# Patient Record
Sex: Female | Born: 1970 | Race: White | Hispanic: Yes | Marital: Single | Smoking: Never smoker
Health system: Southern US, Community
[De-identification: ages and names within clinical notes are randomized; demographics above are authoritative.]

## PROBLEM LIST (undated history)

## (undated) ENCOUNTER — Emergency Department (HOSPITAL_COMMUNITY): Payer: BLUE CROSS/BLUE SHIELD

## (undated) DIAGNOSIS — D219 Benign neoplasm of connective and other soft tissue, unspecified: Secondary | ICD-10-CM

## (undated) HISTORY — DX: Benign neoplasm of connective and other soft tissue, unspecified: D21.9

---

## 2000-05-17 ENCOUNTER — Other Ambulatory Visit: Admission: RE | Admit: 2000-05-17 | Discharge: 2000-05-17 | Payer: Self-pay | Admitting: *Deleted

## 2004-01-08 ENCOUNTER — Other Ambulatory Visit: Admission: RE | Admit: 2004-01-08 | Discharge: 2004-01-08 | Payer: Self-pay | Admitting: Obstetrics and Gynecology

## 2004-01-09 ENCOUNTER — Encounter: Admission: RE | Admit: 2004-01-09 | Discharge: 2004-01-09 | Payer: Self-pay | Admitting: Obstetrics and Gynecology

## 2005-03-02 ENCOUNTER — Other Ambulatory Visit: Admission: RE | Admit: 2005-03-02 | Discharge: 2005-03-02 | Payer: Self-pay | Admitting: Obstetrics and Gynecology

## 2005-05-12 IMAGING — US US SOFT TISSUE HEAD/NECK
1 series · 14 of 25 positions shown · non-contrast
Comparison: none

CLINICAL DATA: Enlarged thyroid on the right. 
 ULTRASOUND SOFT TISSUE HEAD/NECK
 Scans over the thyroid gland were performed.  The thyroid gland is within normal limits in size.   The right lobe measures 5.7 cm sagittally with a depth of 1.3 cm and width of 1.8 cm.  The left lobe measures 5.0 cm sagittally with a depth of 1.2 cm and width of 1.8 cm.  The isthmus measures 2.8 mm in thickness.  The thyroid gland is homogeneous in echogenicity.  No focal lesions are seen other than a small hypoechoic structure in the lower pole at the right lobe measuring 2.1 x 2 mm.  
 IMPRESSION
 Negative ultrasound of the thyroid gland.

[Series 1: unknown · 0.22mm/px · 14 of 62 slices shown]
[im 1/62]
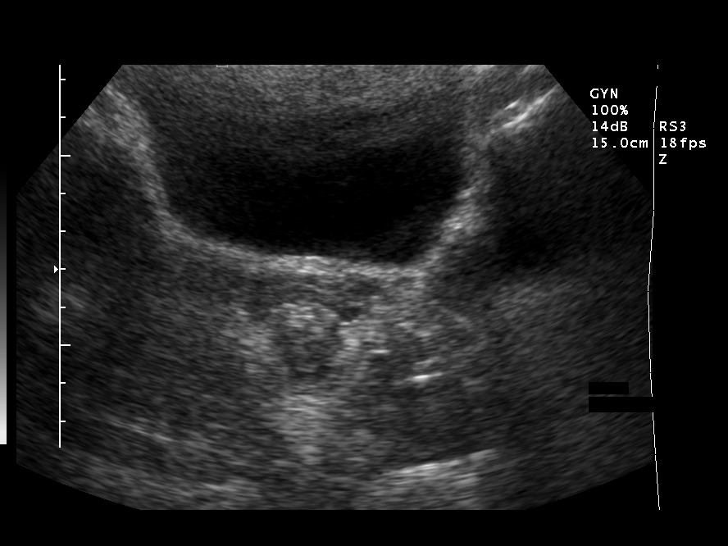
[im 6/62]
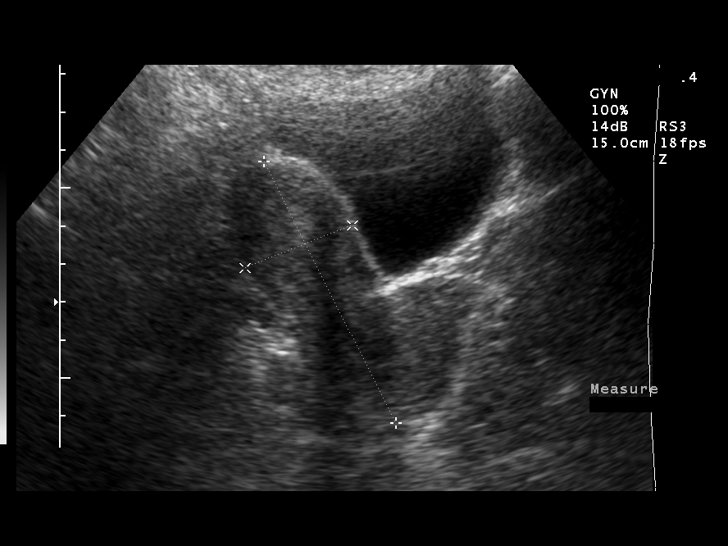
[im 11/62]
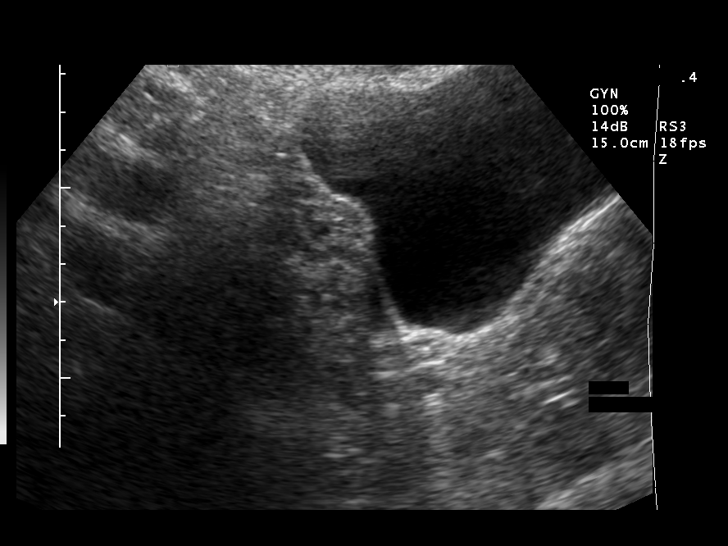
[im 16/62]
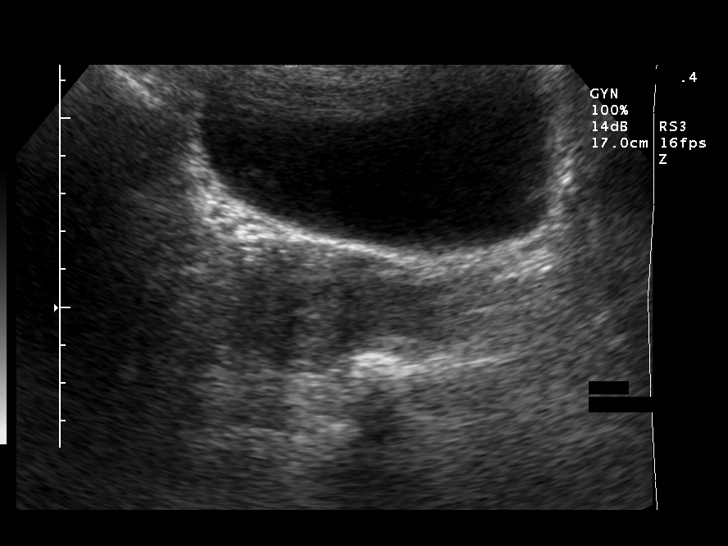
[im 21/62]
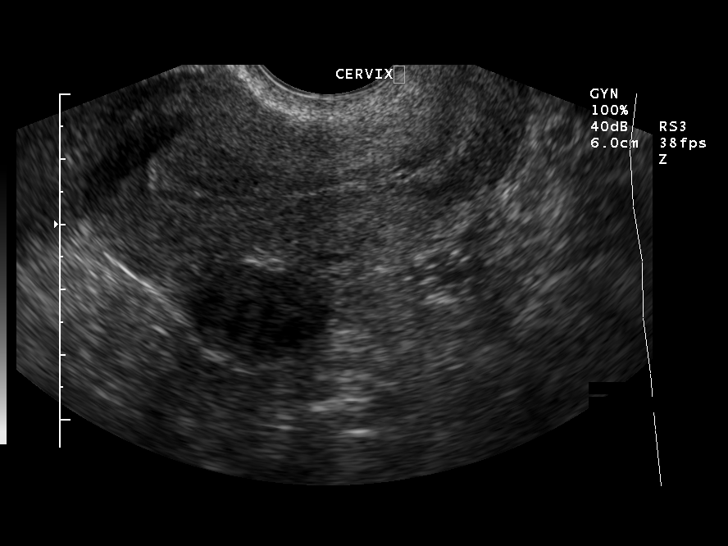
[im 23/62]
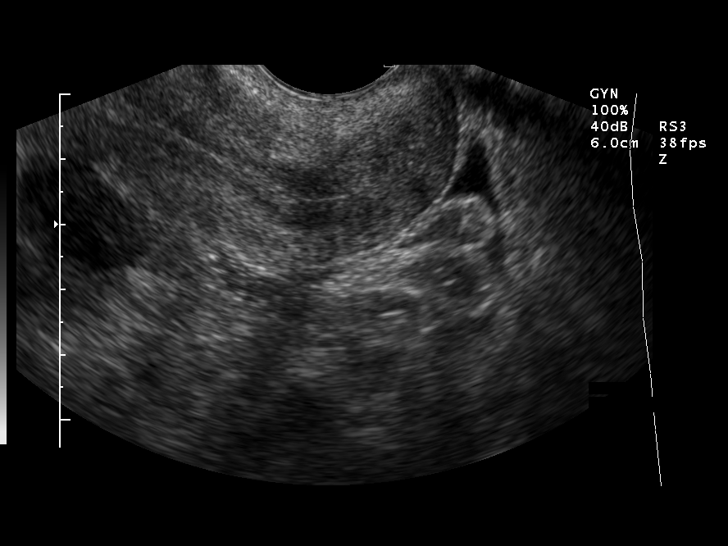
[im 28/62]
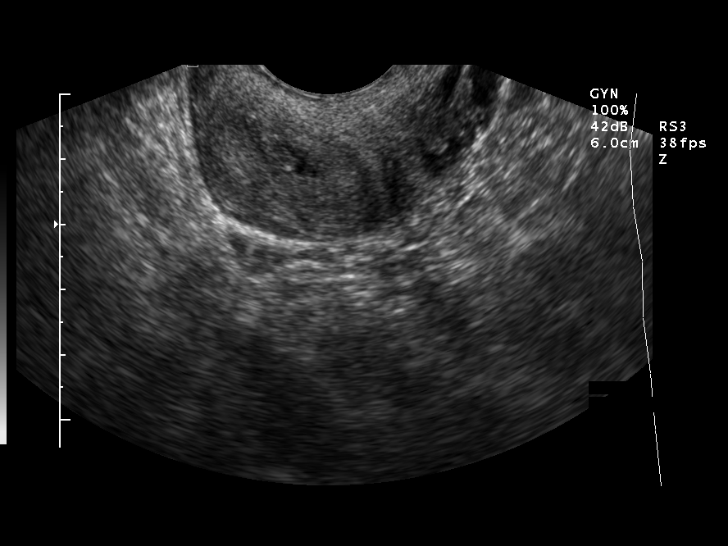
[im 34/62]
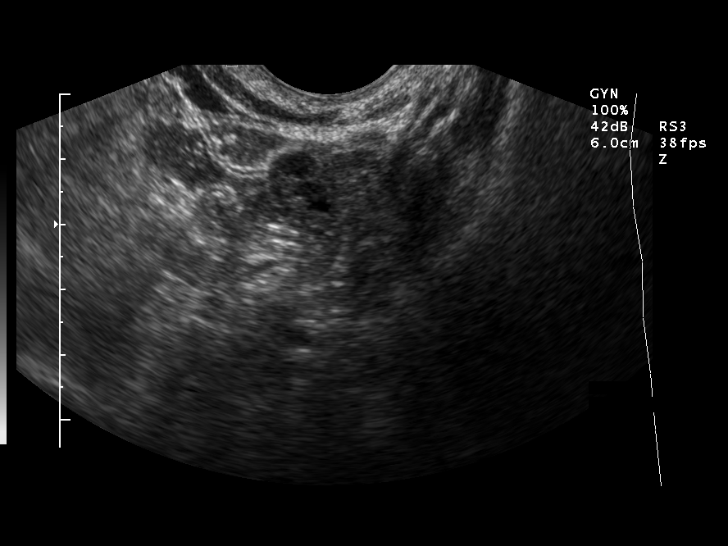
[im 39/62]
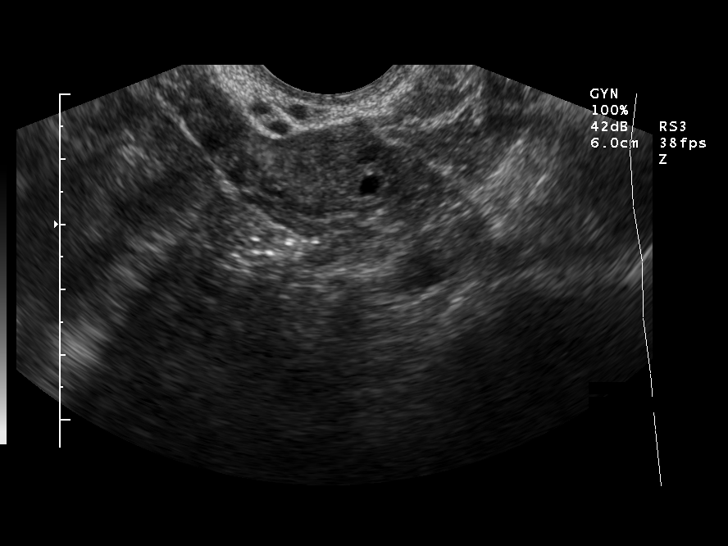
[im 41/62]
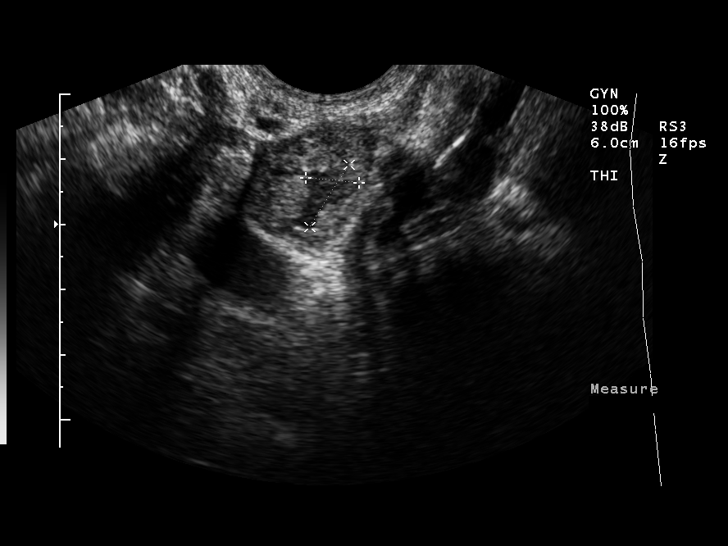
[im 46/62]
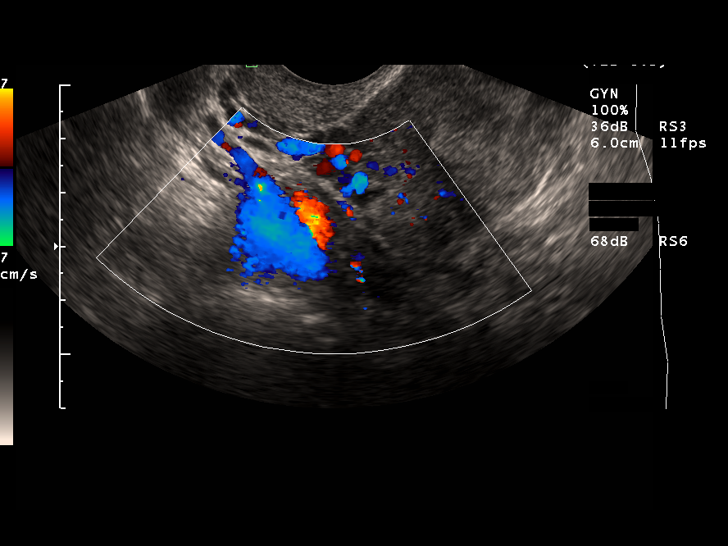
[im 51/62]
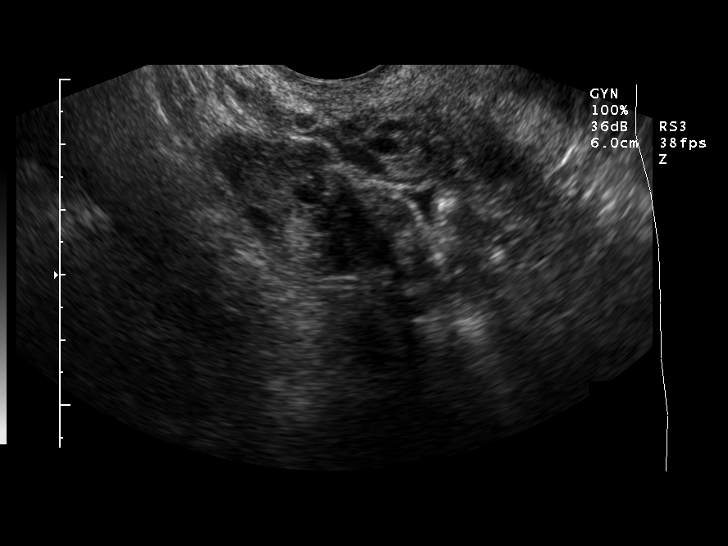
[im 56/62]
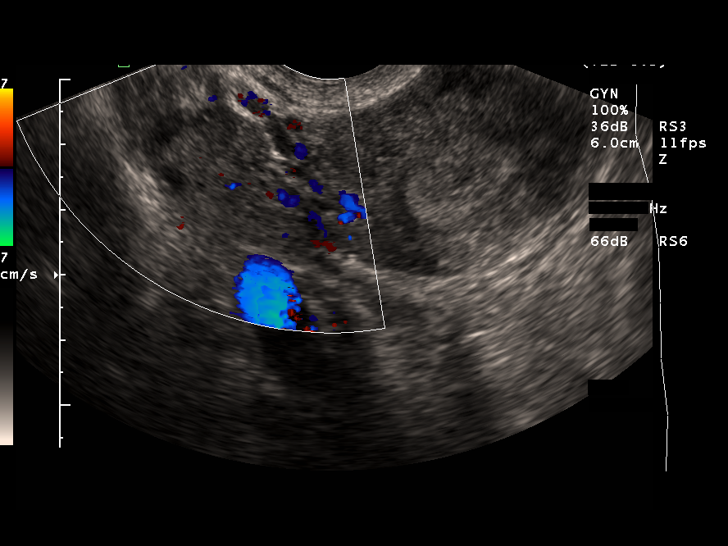
[im 62/62]
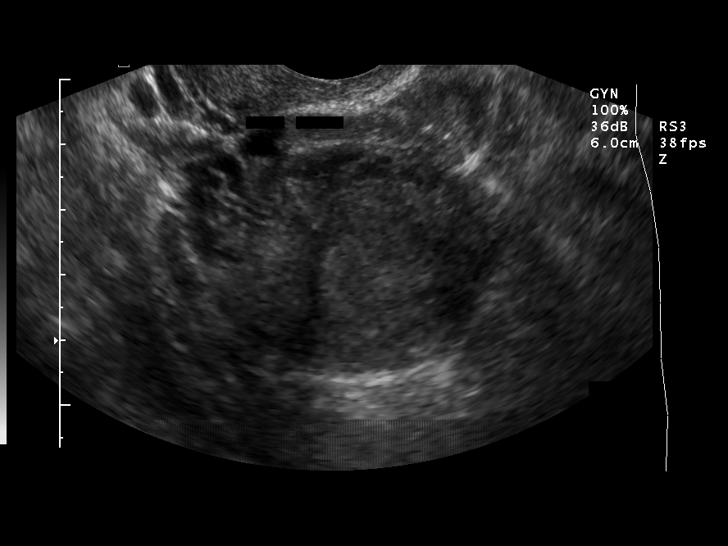

[14 of 25 positions shown; findings below may reference images not displayed]

## 2006-08-03 HISTORY — PX: MYOMECTOMY: SHX85

## 2007-01-25 ENCOUNTER — Encounter (INDEPENDENT_AMBULATORY_CARE_PROVIDER_SITE_OTHER): Payer: Self-pay | Admitting: Obstetrics and Gynecology

## 2007-01-25 ENCOUNTER — Ambulatory Visit (HOSPITAL_COMMUNITY): Admission: RE | Admit: 2007-01-25 | Discharge: 2007-01-25 | Payer: Self-pay | Admitting: Obstetrics and Gynecology

## 2010-12-16 NOTE — H&P (Signed)
NAMECHRISTYNE, [name] NO.:  [id number]   MEDICAL RECORD NO.:  [id number]          PATIENT TYPE:  AMB   LOCATION:  SDC                           FACILITY:  [facility]   PHYSICIAN:  Osborn Coho, M.D.   DATE OF BIRTH:  [birth year]   DATE OF ADMISSION:  DATE OF DISCHARGE:                              HISTORY & PHYSICAL   HISTORY OF PRESENT ILLNESS:  Ms. Stephanie [name] is a 40 year old single African-  American female, gravida 0, who presents for a hysteroscopic removal of  a submucosal fibroid because of irregular bleeding.  For years, the  patient has had prolonged spotting following her 7-day menstrual flow,  which would last for approximately one week.  The patient will describe  this spotting as being very mucus-like and brown in appearance.  This  issue was resolved when she began [place]at contraceptive patches but  in recent months, this trend has recurred since she is no longer on  hormonal contraception.  A pelvic ultrasound in June, 2008 showed a  uterus measuring 7.2 x 3.12 x 4.71 cm along with a posterior  pedunculated fibroid measuring 2.23 cm and a mid body lower uterine  segment submucosal fibroid at the cervical os measuring 1.28 cm x 0.98  cm x 1.16 cm.   A discussion was held with the patient regarding the management options  available, both medical and surgical; however, she has desired to  proceed with surgical excision of this submucosal fibroid.   PAST OB HISTORY:  OB history, gravida 0.   PAST GYN HISTORY:  Menarche 40 years old.  Last menstrual period on Jan 01, 2007.  Patient denies any history of sexually transmitted diseases.  She has a remote history of an abnormal Pap smear.  Last normal Pap  smear in April, 2008.   PAST MEDICAL HISTORY:  Sickle cell trait.  Anemia.   PAST SURGICAL HISTORY:  Negative.   FAMILY HISTORY:  Asthma, hypertension, diabetes.   SOCIAL HISTORY:  Patient is single, and she works for [employer].   HABITS:  She does  not use tobacco.  She occasionally consumes alcohol.   CURRENT MEDICATIONS:  None.   She is allergic to CODEINE.   REVIEW OF SYSTEMS:  Negative except as mentioned in history of present  illness.   PHYSICAL EXAMINATION:  VITAL SIGNS:  Blood pressure 110/80.  Weight is  182.  Height 6 feet, 1 inches tall.  NECK:  Supple without masses.  There is no thyromegaly or cervical  adenopathy.  HEART:  Regular rate and rhythm.  LUNGS:  Clear.  BACK:  No CVA tenderness.  ABDOMEN:  No tenderness, guarding, rebound, or organomegaly.  EXTREMITIES:  No clubbing, cyanosis or edema.  PELVIC:  EG/BUS is normal.  Vagina is normal.  Cervix is nontender.  No  lesions (cervix is stenotic).  Uterus is normal size, shape, and  consistency.  Nontender.  Adnexa, no masses and no tenderness.   IMPRESSION:  1. Irregular menstrual bleeding.  2. Submucosal fibroid.   DISPOSITION:  A discussion was held with the patient regarding the  indications  for her procedure along with its risks, which include but  are not limited to, reaction to anesthesia, damage to adjacent organs,  infection, excessive bleeding, uterine perforation, and endometrial  scarring.  Patient has consented to proceed with hysteroscopic removal  of a submucosal fibroid at [facility] on January 25, 2007 at 1:30 p.m.      [place] J. Adline Peals.      Osborn Coho, M.D.  Electronically Signed    EJP/MEDQ  D:  01/21/2007  T:  01/21/2007  Job:  [id number]

## 2010-12-16 NOTE — Op Note (Signed)
[name], Stephanie [name]                ACCOUNT NO.:  [id number]   MEDICAL RECORD NO.:  [id number]          PATIENT TYPE:  AMB   LOCATION:  SDC                           [facility]ACILITY:  [facility]   PHYSICIAN:  Osborn Coho, M.D.   DATE OF BIRTH:  1971-02-21   DATE OF PROCEDURE:  01/25/2007  DATE OF DISCHARGE:                               OPERATIVE REPORT   PREOPERATIVE DIAGNOSES:  1. Metrorrhagia.  2. Possible submucosal fibroid.   POSTOPERATIVE DIAGNOSES:  1. Metrorrhagia.  2. Polypoid tissue.  3. Cervical stenosis.   PROCEDURE:  1. Hysteroscopy.  2. Dilation and curettage.   ATTENDING:  Dr. Osborn Coho.   ANESTHESIA:  General via LMA.   SPECIMENS TO PATHOLOGY:  Endometrial curettings.   FLUIDS:  1000 mL.   URINE OUTPUT:  Quantity sufficient via straight catheter prior to  procedure.   ESTIMATED BLOOD LOSS:  Minimal.   HYSTEROSCOPIC FLUID DEFICIT:  60 mL of sorbitol.   COMPLICATIONS:  None.   PROCEDURE:  The patient was taken to the operating room after the risks,  benefits and alternatives were reviewed with the patient.  The patient  verbalized understanding, and consent signed and witnessed.  The patient  was placed under general anesthesia, prepped and draped in a normal  sterile fashion in the dorsal lithotomy position.  A bivalve speculum  was placed in the patient's vagina and a paracervical block administered  using a total of 10 mL of 1% lidocaine.  The anterior lip of the cervix  was grasped with a single-tooth tenaculum.  The cervix was noted to be  stenotic and lacrimal duct dilators used.  Dilation was performed for  passage of the hysteroscope.  The uterus was sounded to 8 cm.  Hysteroscope was introduced and no obvious intracavitary lesion noted.  There was polypoid-  appearing tissue, and curettage was performed and curettings sent to  pathology.  A gritty texture was noted, and all instruments were  removed.  There was good hemostasis at the tenaculum  site.  Sponge count  was correct.  The patient tolerated the procedure well and was awaiting  transfer to the recovery room in good condition.      Osborn Coho, M.D.  Electronically Signed     AR/MEDQ  D:  01/25/2007  T:  01/25/2007  Job:  [id number]

## 2011-05-20 LAB — CBC
Hemoglobin: 13.3
MCV: 85.4
Platelets: 186
RDW: 13.4
WBC: 5.8

## 2019-09-08 DIAGNOSIS — M9903 Segmental and somatic dysfunction of lumbar region: Secondary | ICD-10-CM | POA: Diagnosis not present

## 2019-09-08 DIAGNOSIS — M9902 Segmental and somatic dysfunction of thoracic region: Secondary | ICD-10-CM | POA: Diagnosis not present

## 2019-09-08 DIAGNOSIS — M9901 Segmental and somatic dysfunction of cervical region: Secondary | ICD-10-CM | POA: Diagnosis not present

## 2019-09-08 DIAGNOSIS — M9905 Segmental and somatic dysfunction of pelvic region: Secondary | ICD-10-CM | POA: Diagnosis not present

## 2019-09-11 DIAGNOSIS — M9901 Segmental and somatic dysfunction of cervical region: Secondary | ICD-10-CM | POA: Diagnosis not present

## 2019-09-11 DIAGNOSIS — M9902 Segmental and somatic dysfunction of thoracic region: Secondary | ICD-10-CM | POA: Diagnosis not present

## 2019-09-11 DIAGNOSIS — M9905 Segmental and somatic dysfunction of pelvic region: Secondary | ICD-10-CM | POA: Diagnosis not present

## 2019-09-11 DIAGNOSIS — M9903 Segmental and somatic dysfunction of lumbar region: Secondary | ICD-10-CM | POA: Diagnosis not present

## 2019-09-14 DIAGNOSIS — M9905 Segmental and somatic dysfunction of pelvic region: Secondary | ICD-10-CM | POA: Diagnosis not present

## 2019-09-14 DIAGNOSIS — M9902 Segmental and somatic dysfunction of thoracic region: Secondary | ICD-10-CM | POA: Diagnosis not present

## 2019-09-14 DIAGNOSIS — M9901 Segmental and somatic dysfunction of cervical region: Secondary | ICD-10-CM | POA: Diagnosis not present

## 2019-09-14 DIAGNOSIS — M9903 Segmental and somatic dysfunction of lumbar region: Secondary | ICD-10-CM | POA: Diagnosis not present

## 2019-09-18 DIAGNOSIS — M9901 Segmental and somatic dysfunction of cervical region: Secondary | ICD-10-CM | POA: Diagnosis not present

## 2019-09-18 DIAGNOSIS — M9903 Segmental and somatic dysfunction of lumbar region: Secondary | ICD-10-CM | POA: Diagnosis not present

## 2019-09-18 DIAGNOSIS — M9905 Segmental and somatic dysfunction of pelvic region: Secondary | ICD-10-CM | POA: Diagnosis not present

## 2019-09-18 DIAGNOSIS — M9902 Segmental and somatic dysfunction of thoracic region: Secondary | ICD-10-CM | POA: Diagnosis not present

## 2019-09-25 DIAGNOSIS — M9905 Segmental and somatic dysfunction of pelvic region: Secondary | ICD-10-CM | POA: Diagnosis not present

## 2019-09-25 DIAGNOSIS — M9901 Segmental and somatic dysfunction of cervical region: Secondary | ICD-10-CM | POA: Diagnosis not present

## 2019-09-25 DIAGNOSIS — M9903 Segmental and somatic dysfunction of lumbar region: Secondary | ICD-10-CM | POA: Diagnosis not present

## 2019-09-25 DIAGNOSIS — M9902 Segmental and somatic dysfunction of thoracic region: Secondary | ICD-10-CM | POA: Diagnosis not present

## 2019-09-28 DIAGNOSIS — M9902 Segmental and somatic dysfunction of thoracic region: Secondary | ICD-10-CM | POA: Diagnosis not present

## 2019-09-28 DIAGNOSIS — M9905 Segmental and somatic dysfunction of pelvic region: Secondary | ICD-10-CM | POA: Diagnosis not present

## 2019-09-28 DIAGNOSIS — M9901 Segmental and somatic dysfunction of cervical region: Secondary | ICD-10-CM | POA: Diagnosis not present

## 2019-09-28 DIAGNOSIS — M9903 Segmental and somatic dysfunction of lumbar region: Secondary | ICD-10-CM | POA: Diagnosis not present

## 2019-10-02 DIAGNOSIS — M9902 Segmental and somatic dysfunction of thoracic region: Secondary | ICD-10-CM | POA: Diagnosis not present

## 2019-10-02 DIAGNOSIS — M9905 Segmental and somatic dysfunction of pelvic region: Secondary | ICD-10-CM | POA: Diagnosis not present

## 2019-10-02 DIAGNOSIS — M9901 Segmental and somatic dysfunction of cervical region: Secondary | ICD-10-CM | POA: Diagnosis not present

## 2019-10-02 DIAGNOSIS — M9903 Segmental and somatic dysfunction of lumbar region: Secondary | ICD-10-CM | POA: Diagnosis not present

## 2019-10-05 DIAGNOSIS — M9901 Segmental and somatic dysfunction of cervical region: Secondary | ICD-10-CM | POA: Diagnosis not present

## 2019-10-05 DIAGNOSIS — M9903 Segmental and somatic dysfunction of lumbar region: Secondary | ICD-10-CM | POA: Diagnosis not present

## 2019-10-05 DIAGNOSIS — M9905 Segmental and somatic dysfunction of pelvic region: Secondary | ICD-10-CM | POA: Diagnosis not present

## 2019-10-05 DIAGNOSIS — M9902 Segmental and somatic dysfunction of thoracic region: Secondary | ICD-10-CM | POA: Diagnosis not present

## 2019-10-11 DIAGNOSIS — M9902 Segmental and somatic dysfunction of thoracic region: Secondary | ICD-10-CM | POA: Diagnosis not present

## 2019-10-11 DIAGNOSIS — M9901 Segmental and somatic dysfunction of cervical region: Secondary | ICD-10-CM | POA: Diagnosis not present

## 2019-10-11 DIAGNOSIS — M9905 Segmental and somatic dysfunction of pelvic region: Secondary | ICD-10-CM | POA: Diagnosis not present

## 2019-10-11 DIAGNOSIS — M9903 Segmental and somatic dysfunction of lumbar region: Secondary | ICD-10-CM | POA: Diagnosis not present

## 2019-10-16 DIAGNOSIS — M9903 Segmental and somatic dysfunction of lumbar region: Secondary | ICD-10-CM | POA: Diagnosis not present

## 2019-10-16 DIAGNOSIS — M9905 Segmental and somatic dysfunction of pelvic region: Secondary | ICD-10-CM | POA: Diagnosis not present

## 2019-10-16 DIAGNOSIS — M9901 Segmental and somatic dysfunction of cervical region: Secondary | ICD-10-CM | POA: Diagnosis not present

## 2019-10-16 DIAGNOSIS — M9902 Segmental and somatic dysfunction of thoracic region: Secondary | ICD-10-CM | POA: Diagnosis not present

## 2019-10-25 DIAGNOSIS — M9903 Segmental and somatic dysfunction of lumbar region: Secondary | ICD-10-CM | POA: Diagnosis not present

## 2019-10-25 DIAGNOSIS — M9902 Segmental and somatic dysfunction of thoracic region: Secondary | ICD-10-CM | POA: Diagnosis not present

## 2019-10-25 DIAGNOSIS — M9901 Segmental and somatic dysfunction of cervical region: Secondary | ICD-10-CM | POA: Diagnosis not present

## 2019-10-25 DIAGNOSIS — M9905 Segmental and somatic dysfunction of pelvic region: Secondary | ICD-10-CM | POA: Diagnosis not present

## 2019-11-01 DIAGNOSIS — M9905 Segmental and somatic dysfunction of pelvic region: Secondary | ICD-10-CM | POA: Diagnosis not present

## 2019-11-01 DIAGNOSIS — M9901 Segmental and somatic dysfunction of cervical region: Secondary | ICD-10-CM | POA: Diagnosis not present

## 2019-11-01 DIAGNOSIS — M9903 Segmental and somatic dysfunction of lumbar region: Secondary | ICD-10-CM | POA: Diagnosis not present

## 2019-11-01 DIAGNOSIS — M9902 Segmental and somatic dysfunction of thoracic region: Secondary | ICD-10-CM | POA: Diagnosis not present

## 2020-05-10 ENCOUNTER — Ambulatory Visit: Payer: Self-pay | Attending: Internal Medicine

## 2020-05-10 ENCOUNTER — Other Ambulatory Visit: Payer: Self-pay | Admitting: Internal Medicine

## 2020-05-10 DIAGNOSIS — Z23 Encounter for immunization: Secondary | ICD-10-CM

## 2020-05-10 NOTE — Progress Notes (Signed)
° °  Covid-19 Vaccination Clinic  Name:  Stephanie [name]    MRN: [id number] DOB: 11-Feb-1971  05/10/2020  [name] was observed post Covid-19 immunization for 15 minutes without incident. She was provided with Vaccine Information Sheet and instruction to access the V-Safe system.   [name] was instructed to call 911 with any severe reactions post vaccine:  Difficulty breathing   Swelling of face and throat   A fast heartbeat   A bad rash all over body   Dizziness and weakness   Immunizations Administered    Name Date Dose VIS Date Route   JANSSEN COVID-19 VACCINE 05/10/2020  2:49 PM 0.5 mL 09/30/2019 Intramuscular   Manufacturer: [employer]   Lot: [id number]   [place]: [id number]

## 2020-05-28 DIAGNOSIS — F4323 Adjustment disorder with mixed anxiety and depressed mood: Secondary | ICD-10-CM | POA: Diagnosis not present

## 2020-06-03 DIAGNOSIS — E559 Vitamin D deficiency, unspecified: Secondary | ICD-10-CM | POA: Diagnosis not present

## 2020-06-03 DIAGNOSIS — D649 Anemia, unspecified: Secondary | ICD-10-CM | POA: Diagnosis not present

## 2020-06-03 DIAGNOSIS — E6609 Other obesity due to excess calories: Secondary | ICD-10-CM | POA: Diagnosis not present

## 2020-06-03 DIAGNOSIS — Z833 Family history of diabetes mellitus: Secondary | ICD-10-CM | POA: Diagnosis not present

## 2020-06-07 DIAGNOSIS — F4323 Adjustment disorder with mixed anxiety and depressed mood: Secondary | ICD-10-CM | POA: Diagnosis not present

## 2020-06-12 DIAGNOSIS — F4323 Adjustment disorder with mixed anxiety and depressed mood: Secondary | ICD-10-CM | POA: Diagnosis not present

## 2020-06-25 ENCOUNTER — Ambulatory Visit: Payer: Self-pay

## 2020-07-04 DIAGNOSIS — F4323 Adjustment disorder with mixed anxiety and depressed mood: Secondary | ICD-10-CM | POA: Diagnosis not present

## 2020-07-08 DIAGNOSIS — K1329 Other disturbances of oral epithelium, including tongue: Secondary | ICD-10-CM | POA: Diagnosis not present

## 2020-07-12 DIAGNOSIS — K134 Granuloma and granuloma-like lesions of oral mucosa: Secondary | ICD-10-CM | POA: Diagnosis not present

## 2020-07-17 ENCOUNTER — Encounter: Payer: Self-pay | Admitting: Certified Nurse Midwife

## 2020-07-23 DIAGNOSIS — F4323 Adjustment disorder with mixed anxiety and depressed mood: Secondary | ICD-10-CM | POA: Diagnosis not present

## 2020-12-06 DIAGNOSIS — H00014 Hordeolum externum left upper eyelid: Secondary | ICD-10-CM | POA: Diagnosis not present

## 2020-12-06 DIAGNOSIS — S40861A Insect bite (nonvenomous) of right upper arm, initial encounter: Secondary | ICD-10-CM | POA: Diagnosis not present

## 2020-12-06 DIAGNOSIS — W57XXXA Bitten or stung by nonvenomous insect and other nonvenomous arthropods, initial encounter: Secondary | ICD-10-CM | POA: Diagnosis not present

## 2022-09-14 ENCOUNTER — Encounter: Payer: Self-pay | Admitting: Obstetrics and Gynecology

## 2022-09-14 ENCOUNTER — Ambulatory Visit (INDEPENDENT_AMBULATORY_CARE_PROVIDER_SITE_OTHER): Payer: BLUE CROSS/BLUE SHIELD | Admitting: Obstetrics and Gynecology

## 2022-09-14 ENCOUNTER — Other Ambulatory Visit (HOSPITAL_COMMUNITY)
Admission: RE | Admit: 2022-09-14 | Discharge: 2022-09-14 | Disposition: A | Discharge status: Home / Self Care | Payer: BLUE CROSS/BLUE SHIELD | Source: Ambulatory Visit | Attending: Obstetrics and Gynecology | Admitting: Obstetrics and Gynecology

## 2022-09-14 VITALS — BP 122/79 | HR 78 | Ht 72.0 in | Wt 229.0 lb

## 2022-09-14 DIAGNOSIS — Z01411 Encounter for gynecological examination (general) (routine) with abnormal findings: Secondary | ICD-10-CM | POA: Diagnosis present

## 2022-09-14 DIAGNOSIS — Z758 Other problems related to medical facilities and other health care: Secondary | ICD-10-CM | POA: Diagnosis not present

## 2022-09-14 DIAGNOSIS — D259 Leiomyoma of uterus, unspecified: Secondary | ICD-10-CM | POA: Diagnosis not present

## 2022-09-14 MED ORDER — IBUPROFEN 800 MG PO TABS: 800.0000 mg | ORAL_TABLET | Freq: Three times a day (TID) | ORAL | 1 refills | Status: AC | PRN

## 2022-09-14 NOTE — Progress Notes (Signed)
New pt to office for routine AEX. Last pap and mammo 1 year ago.   Pt states she had myomectomy in 2008, states fibroids are bigger now - may be interested in UFE.

## 2022-09-14 NOTE — Progress Notes (Signed)
Subjective:     Stephanie [name] is a 52 y.o. female P0 with LMP 08/26/22 and  BMI 31 who is here for a comprehensive physical exam. The patient reports heavy vaginal bleeding with fibroid uterus and desires [place]. Patient reports a monthly period lasting 7-10 days with the first 3 days bring heavy in flow. She is not sexually active. She admits to occasional pelvic pressure. Patient with known fibroid uterus and had plans for [place] in 2020 but that was aborted due to the pandemic. Her last ultrasound was in 08/2021 and demonstrated a 20 cm fibroid uterus. She is without any other complaints. She denies pelvic pain or abnormal discharge. She denies urinary complaints or constipation.  History reviewed. No pertinent past medical history. History reviewed. No pertinent surgical history. History reviewed. No pertinent family history.  Social History   Socioeconomic History   Marital status: Single    Spouse name: Not on file   Number of children: Not on file   Years of education: Not on file   Highest education level: Not on file  Occupational History   Not on file  Tobacco Use   Smoking status: Not on file   Smokeless tobacco: Not on file  Substance and Sexual Activity   Alcohol use: Not on file   Drug use: Not on file   Sexual activity: Not on file  Other Topics Concern   Not on file  Social History Narrative   Not on file   Social Determinants of Health   Financial Resource Strain: Not on file  Food Insecurity: Not on file  Transportation Needs: Not on file  Physical Activity: Not on file  Stress: Not on file  Social Connections: Not on file  Intimate Partner Violence: Not on file   Health Maintenance  Topic Date Due   HIV Screening  Never done   Hepatitis C Screening  Never done   DTaP/Tdap/Td (1 - Tdap) Never done   COLONOSCOPY (Pts 45-62yr Insurance coverage will need to be confirmed)  Never done   Zoster Vaccines- Shingrix (1 of 2) Never done   INFLUENZA VACCINE  Never  done   COVID-19 Vaccine (2 - 2023-24 season) 04/03/2022   MAMMOGRAM  08/21/2023   PAP SMEAR-Modifier  08/20/2024   HPV VACCINES  Aged Out       Review of Systems Pertinent items noted in HPI and remainder of comprehensive ROS otherwise negative.   Objective:  Blood pressure 122/79, pulse 78, height 6' (1.829 m), weight 229 lb (103.9 kg), last menstrual period 08/26/2022.   GENERAL: Well-developed, well-nourished female in no acute distress.  HEENT: Normocephalic, atraumatic. Sclerae anicteric.  NECK: Supple. Normal thyroid.  LUNGS: Clear to auscultation bilaterally.  HEART: Regular rate and rhythm. BREASTS: Symmetric in size. No palpable masses or lymphadenopathy, skin changes, or nipple drainage. ABDOMEN: Soft, nontender, nondistended. No organomegaly. PELVIC: Normal external female genitalia. Vagina is pink and rugated.  Normal discharge. Normal appearing cervix. Uterus is normal in size. No adnexal mass or tenderness. Chaperone present during the pelvic exam EXTREMITIES: No cyanosis, clubbing, or edema, 2+ distal pulses.     08/2021 ultrasound  EXAM:  ULTRASOUND PELVIS COMPLETE AND TRANSVAGINAL   HISTORY:  Fibroid.   TECHNIQUE:  Transabdominal scanning performed with images obtained of the region of the uterus, endometrium, ovaries and adnexa. Detail is limited by transabdominal scanning, with incomplete visualization of all structures. Transvaginal scanning was  therefore required to fully evaluate the pelvic structures and was performed. Representative 2D  gray-scale transabdominal and transvaginal images obtained.   COMPARISON:  May 14, 2018   FINDINGS:   LMP: July 30, 2021   UTERUS: Measures (L x AP x W) 20.2 x 11.4 x 14.0 cm. Anteverted. Heterogeneous echotexture.   Fibroid/s:  1, Unable to measure.  2. 4.1 x 3.9 x 3.8 cm subserosal fibroid in the right posterior body.  3. 4.3 x 3.5 x 3.4 cm subserosal fibroid in left posterior body, new.   Nabothian  cyst/s: None.   ENDOMETRIUM: Nonvisualized and distorted by the presence of large fibroids.   RIGHT OVARY:  Not visualized due to body habitus. No solid or complex adnexal lesions identified.   LEFT OVARY: Not visualized due to body habitus. No solid or complex adnexal lesions identified.   Free Fluid: Trace free pelvic fluid.    IMPRESSION:    Large uterine fibroids measuring up to 4.3 cm.   This report dictated by Alfonse Ras MD on 08/22/2021  Assessment:    Healthy female exam.       Plan:    - Reviewed ultrasound report with patient which demonstrated an enlarged fibroid uterus - Discussed management options with medication to control amount of vaginal bleeding, uterine artery embolization and definitive treatment with hysterectomy. Patient is still interested in [place] and understands that this procedure may note alleviate her pelvic pressure. Patient verbalized understanding and desires to more forward with referral. Repeat imaging to be ordered if needed by IR - Pap smear collected - Screening mammogram ordered - Patient referred to GI for colonoscopy - Patient also referred to establish care with PCP - Baseline labs ordered - Patient will be contacted with abnormal results See After Visit Summary for Counseling Recommendations

## 2022-09-15 LAB — COMPREHENSIVE METABOLIC PANEL
ALT: 13 IU/L (ref 0–32)
AST: 16 IU/L (ref 0–40)
Albumin/Globulin Ratio: 1.5 (ref 1.2–2.2)
Albumin: 4.3 g/dL (ref 3.8–4.9)
Alkaline Phosphatase: 54 IU/L (ref 44–121)
BUN/Creatinine Ratio: 13 (ref 9–23)
BUN: 11 mg/dL (ref 6–24)
Bilirubin Total: 0.3 mg/dL (ref 0.0–1.2)
CO2: 21 mmol/L (ref 20–29)
Calcium: 9.5 mg/dL (ref 8.7–10.2)
Chloride: 102 mmol/L (ref 96–106)
Creatinine, Ser: 0.84 mg/dL (ref 0.57–1.00)
Globulin, Total: 2.9 g/dL (ref 1.5–4.5)
Glucose: 81 mg/dL (ref 70–99)
Potassium: 4.4 mmol/L (ref 3.5–5.2)
Sodium: 136 mmol/L (ref 134–144)
Total Protein: 7.2 g/dL (ref 6.0–8.5)
eGFR: 84 mL/min/{1.73_m2} (ref >59)

## 2022-09-15 LAB — CBC
Hematocrit: 37.7 % (ref 34.0–46.6)
Hemoglobin: 11.6 g/dL (ref 11.1–15.9)
MCH: 24.1 pg — ABNORMAL LOW (ref 26.6–33.0)
MCHC: 30.8 g/dL — ABNORMAL LOW (ref 31.5–35.7)
MCV: 78 fL — ABNORMAL LOW (ref 79–97)
Platelets: 240 10*3/uL (ref 150–450)
RBC: 4.82 x10E6/uL (ref 3.77–5.28)
RDW: 16.8 % — ABNORMAL HIGH (ref 11.7–15.4)
WBC: 7 10*3/uL (ref 3.4–10.8)

## 2022-09-15 LAB — HEMOGLOBIN A1C
Est. average glucose Bld gHb Est-mCnc: 117 mg/dL
Hgb A1c MFr Bld: 5.7 % — ABNORMAL HIGH (ref 4.8–5.6)

## 2022-09-15 LAB — TSH: TSH: 1.81 u[IU]/mL (ref 0.450–4.500)

## 2022-09-15 NOTE — Progress Notes (Signed)
TC. Advised pt of normal results with exception of prediabetic range HgbA1C. Given Dr. Domenic Schwab recommendations. Pt requests new activation code MyChart via email. Done.

## 2022-09-16 ENCOUNTER — Other Ambulatory Visit: Payer: Self-pay | Admitting: Obstetrics and Gynecology

## 2022-09-16 DIAGNOSIS — D250 Submucous leiomyoma of uterus: Secondary | ICD-10-CM

## 2022-09-17 ENCOUNTER — Other Ambulatory Visit: Payer: Self-pay | Admitting: Interventional Radiology

## 2022-09-17 ENCOUNTER — Ambulatory Visit
Admission: RE | Admit: 2022-09-17 | Discharge: 2022-09-17 | Disposition: A | Discharge status: Home / Self Care | Payer: BLUE CROSS/BLUE SHIELD | Source: Ambulatory Visit | Attending: Obstetrics and Gynecology | Admitting: Obstetrics and Gynecology

## 2022-09-17 DIAGNOSIS — D250 Submucous leiomyoma of uterus: Secondary | ICD-10-CM

## 2022-09-17 DIAGNOSIS — Z01411 Encounter for gynecological examination (general) (routine) with abnormal findings: Secondary | ICD-10-CM

## 2022-09-17 LAB — CYTOLOGY - PAP
Adequacy: ABSENT
Comment: NEGATIVE
Diagnosis: NEGATIVE
High risk HPV: NEGATIVE

## 2022-09-17 NOTE — Consult Note (Signed)
Chief Complaint: Symptomatic uterine fibroids  Referring Physician(s): Constant,Peggy  History of Present Illness: Stephanie [name] is a 52 y.o. female with history of myomectomy in 2008 who has been referred for evaluation of symptomatic uterine fibroids.  The patient has a long history of symptomatic uterine fibroids, evident by her history of myomectomy in 2008.  Patient wanted to undergo a uterine fibroid embolization in 2020 however cannot be seen secondary to the [place] pandemic.  Since that time, the patient has relocated to this area and is interested and pursuing evaluation for treatment.  The patient's primary fibroid related complaint is in regards to menorrhagia.  She states that her cycles are regular, occurring once per month lasting approximately 10 days however the first 2 to 3 days are associated with excessive menorrhagia including the passage of blood clots.  The patient reports intermittent pelvic cramping at the time of her cycle though states she feels this is due to the passage of her clots.  She also reports intermittent bloating.  She denies urinary symptoms such as dysuria or urinary frequency.  Despite the patient being 52 years old, she denies any perimenopausal symptoms.  At the present time, patient wishes to pursue intervention that wishes to avoid definitive hysterectomy.   Past Medical History:  Diagnosis Date   Fibroids     Past Surgical History:  Procedure Laterality Date   MYOMECTOMY  2008    Allergies: Codeine  Medications: Prior to Admission medications   Medication Sig Start Date End Date Taking? Authorizing Provider  ibuprofen (ADVIL) 800 MG tablet Take 800 mg by mouth every 8 (eight) hours as needed.    [provider]  ibuprofen (ADVIL) 800 MG tablet Take 1 tablet (800 mg total) by mouth every 8 (eight) hours as needed. 09/14/22   Constant, Peggy, MD     Family History  Problem Relation Age of Onset   Hypertension  Mother    Diabetes Mother     Social History   Socioeconomic History   Marital status: Single    Spouse name: Not on file   Number of children: Not on file   Years of education: Not on file   Highest education level: Not on file  Occupational History   Not on file  Tobacco Use   Smoking status: Former    Types: Cigarettes   Smokeless tobacco: Former  Scientific laboratory technician Use: Never used  Substance and Sexual Activity   Alcohol use: Yes   Drug use: Not Currently   Sexual activity: Not Currently    Birth control/protection: None  Other Topics Concern   Not on file  Social History Narrative   Not on file   Social Determinants of Health   Financial Resource Strain: Not on file  Food Insecurity: Not on file  Transportation Needs: Not on file  Physical Activity: Not on file  Stress: Not on file  Social Connections: Not on file    ECOG Status: 1 - Symptomatic but completely ambulatory  Review of Systems: A 12 point ROS discussed and pertinent positives are indicated in the HPI above.  All other systems are negative.  Review of Systems  Vital Signs: LMP 08/26/2022     Physical Exam  Imaging:  No imaging is available for review however there is a report from a pelvic ultrasound performed in January 2023 which describes a subserosal fibroid in the right posterior body measuring 4.1 cm as well as a 4.3 cm subserosal fibroid  within the left posterior body.  Labs:  CBC: Recent Labs    09/14/22 1609  WBC 7.0  HGB 11.6  HCT 37.7  PLT 240    COAGS: No results for input(s): "INR", "APTT" in the last 8760 hours.  BMP: Recent Labs    09/14/22 1609  NA 136  K 4.4  CL 102  CO2 21  GLUCOSE 81  BUN 11  CALCIUM 9.5  CREATININE 0.84    LIVER FUNCTION TESTS: Recent Labs    09/14/22 1609  BILITOT 0.3  AST 16  ALT 13  ALKPHOS 54  PROT 7.2  ALBUMIN 4.3    Pap Smear - 09/14/2022 -  negative  Assessment and Plan:  [name] is a 52 y.o.  female with history of myomectomy in 2008 who has been referred for evaluation of symptomatic uterine fibroids.  The patient has a long history of symptomatic uterine fibroids, evident by her history of myomectomy in 2008.  Patient wanted to undergo a uterine fibroid embolization in 2020 however cannot be seen secondary to the [place] pandemic.  Since that time, the patient has relocated to this area and is interested and pursuing evaluation for treatment.  The patient's primary fibroid related complaint is in regards to menorrhagia, thought does report intermittent pelvic cramping at the time of her cycle and reports intermittent bloating.    Despite the patient being 52 years old, she denies any perimenopausal symptoms.  Prolonged conversations were held with the patient regarding potential treatment options for her symptomatic uterine fibroids including continued conservative management and hysterectomy.  At the present time, the patient wishes to undergo intervention though wishes to avoid surgery.  As such, prolonged conversations were held with the patient regarding the benefits and risks (including but not limited to bleeding, vessel injury, nontarget embolization, infection, contrast and radiation exposure) of uterine fibroid embolization. Following this prolonged and detailed conversation, the patient wishes to pursue UFE.  As such, I will obtain a contrast enhanced pelvic MRI to better delineate the fibroid size and burden and to ensure fibroid enhancement given her perimenopausal state. Of note, the patient had a negative Pap smear (09/14/2022).  The patient wishes to have a follow-up consultation following acquisition of the contrast-enhanced pelvic MRI.  Plan: - Repeat telemedicine consultation following acquisition of contrast-enhanced pelvic MRI.  Thank you for this interesting consult.  I greatly enjoyed meeting Stephanie [name] and look forward to participating in their care.  A copy  of this report was sent to the requesting provider on this date.  Electronically Signed: Sandi Mariscal 09/17/2022, 2:37 PM   I spent a total of 30 Minutes in remote  clinical consultation, greater than 50% of which was counseling/coordinating care for symptomatic uterine fibroid.    Visit type: Audio only (telephone). Audio (no video) only due to patient's lack of internet/smartphone capability. Alternative for in-person consultation at [facility], [address], [place], [place]. This visit type was conducted due to national recommendations for restrictions regarding the COVID-19 Pandemic (e.g. social distancing).  This format is felt to be most appropriate for this patient at this time.  All issues noted in this document were discussed and addressed.

## 2022-09-21 ENCOUNTER — Ambulatory Visit (HOSPITAL_COMMUNITY)
Admission: RE | Admit: 2022-09-21 | Discharge: 2022-09-21 | Disposition: A | Discharge status: Home / Self Care | Payer: BLUE CROSS/BLUE SHIELD | Source: Ambulatory Visit | Attending: Interventional Radiology | Admitting: Interventional Radiology

## 2022-09-21 DIAGNOSIS — D250 Submucous leiomyoma of uterus: Secondary | ICD-10-CM | POA: Diagnosis not present

## 2022-09-21 MED ORDER — GADOBUTROL 1 MMOL/ML IV SOLN
10.0000 mL | Freq: Once | INTRAVENOUS | Status: AC | PRN
  Administered 2022-09-21: 10 mL via INTRAVENOUS

## 2022-09-29 ENCOUNTER — Ambulatory Visit
Admission: RE | Admit: 2022-09-29 | Discharge: 2022-09-29 | Disposition: A | Discharge status: Home / Self Care | Payer: BLUE CROSS/BLUE SHIELD | Source: Ambulatory Visit | Attending: Interventional Radiology | Admitting: Interventional Radiology

## 2022-09-29 DIAGNOSIS — D250 Submucous leiomyoma of uterus: Secondary | ICD-10-CM

## 2022-09-29 NOTE — Progress Notes (Signed)
Patient ID: Stephanie [name], female   DOB: 10/25/1970, 52 y.o.   MRN: [id number]        Chief Complaint: Symptomatic uterine fibroids   Referring Physician(s): Constant,Peggy   History of Present Illness: Stephanie [name] is a 52 y.o. female with history of myomectomy in 2008 who has been referred for evaluation of symptomatic uterine fibroids, initially seen on consult on 09/17/22.  She returns today following acquisition of pelvic MRI performed on 09/21/22.   In review, the patient has a long history of symptomatic uterine fibroids, evident by her history of myomectomy in 2008.   Patient wanted to undergo a uterine fibroid embolization in 2020 however could not be seen secondary to the [place] pandemic.  Since that time, the patient has relocated to this area and is interested and pursuing evaluation for treatment.   The patient's primary fibroid related complaint is in regards to menorrhagia.  She states that her cycles are regular, occurring once per month lasting approximately 10 days however the first 2 to 3 days are associated with excessive menorrhagia including the passage of blood clots.   The patient reports intermittent pelvic cramping at the time of her cycle though states she feels this is due to the passage of her clots.  She also reports intermittent bloating.  She denies urinary symptoms such as dysuria or urinary frequency.   Despite the patient being 52 years old, she denies any perimenopausal symptoms.   At the present time, patient wishes to pursue intervention that wishes to avoid definitive hysterectomy.  Past Medical History:  Diagnosis Date   Fibroids     Past Surgical History:  Procedure Laterality Date   MYOMECTOMY  2008    Allergies: Codeine  Medications: Prior to Admission medications   Medication Sig Start Date End Date Taking? Authorizing Provider  ibuprofen (ADVIL) 800 MG tablet Take 800 mg by mouth every 8 (eight) hours as needed.    [provider]  ibuprofen (ADVIL) 800 MG tablet Take 1 tablet (800 mg total) by mouth every 8 (eight) hours as needed. 09/14/22   Constant, Peggy, MD     Family History  Problem Relation Age of Onset   Hypertension Mother    Diabetes Mother    Breast cancer Neg Hx     Social History   Socioeconomic History   Marital status: Single    Spouse name: Not on file   Number of children: Not on file   Years of education: Not on file   Highest education level: Not on file  Occupational History   Not on file  Tobacco Use   Smoking status: Former    Types: Cigarettes   Smokeless tobacco: Former  Scientific laboratory technician Use: Never used  Substance and Sexual Activity   Alcohol use: Yes   Drug use: Not Currently   Sexual activity: Not Currently    Birth control/protection: None  Other Topics Concern   Not on file  Social History Narrative   Not on file   Social Determinants of Health   Financial Resource Strain: Not on file  Food Insecurity: Not on file  Transportation Needs: Not on file  Physical Activity: Not on file  Stress: Not on file  Social Connections: Not on file    ECOG Status: 1 - Symptomatic but completely ambulatory  Review of Systems  Review of Systems: A 12 point ROS discussed and pertinent positives are indicated in the HPI above.  All other systems  are negative.    Physical Exam No direct physical exam was performed (except for noted visual exam findings with Video Visits).   Vital Signs: LMP 08/26/2022   Imaging:  Personal review of pelvic MRI performed 09/21/2022 demonstrates an enlarged and myomatous uterus with several heterogeneously enhancing uterine fibroids, with dominant fibroid within the left side of the uterine body/fundus measuring approximately 14.5 x 10.6 x 10.0 cm.  No discrete adnexal lesions.  MM 3D SCREEN BREAST BILATERAL  Result Date: 09/21/2022 CLINICAL DATA:  Screening. EXAM: DIGITAL SCREENING BILATERAL MAMMOGRAM WITH  TOMOSYNTHESIS AND CAD TECHNIQUE: Bilateral screening digital craniocaudal and mediolateral oblique mammograms were obtained. Bilateral screening digital breast tomosynthesis was performed. The images were evaluated with computer-aided detection. COMPARISON:  None available. ACR Breast Density Category c: The breasts are heterogeneously dense, which may obscure small masses. FINDINGS: There are no findings suspicious for malignancy. IMPRESSION: No mammographic evidence of malignancy. A result letter of this screening mammogram will be mailed directly to the patient. RECOMMENDATION: Screening mammogram in one year. (Code:SM-B-01Y) BI-RADS CATEGORY  1: Negative. Electronically Signed   By: Abelardo Diesel M.D.   On: 09/21/2022 13:46   MR PELVIS W WO CONTRAST  Result Date: 09/21/2022 CLINICAL DATA:  Symptomatic uterine fibroids. Previous myomectomy. Treatment planning. EXAM: MRI PELVIS WITHOUT AND WITH CONTRAST TECHNIQUE: Multiplanar multisequence MR imaging of the pelvis was performed both before and after administration of intravenous contrast. CONTRAST:  35m GADAVIST GADOBUTROL 1 MMOL/ML IV SOLN COMPARISON:  None Available. FINDINGS: Lower Urinary Tract: No bladder or urethral abnormality identified. Bowel:  Unremarkable visualized pelvic bowel loops. Vascular/Lymphatic: No pathologically enlarged lymph nodes or other significant abnormality. Reproductive: -- Uterus: Measures 20.0 x 11.5 x 14.9 cm (volume = 1790 cm^3). A dominant intramural fibroid is seen in the left lateral corpus and fundus, which has a submucosal component displacing the endometrial cavity to the right. This measures 14.5 cm in maximum diameter. Multiple other smaller intramural and subserosal fibroids are seen which measure up to 4.6 cm in maximum diameter. All of the subserosal fibroids show broad-based soft tissue pedicles of attachment to the uterus. None of these fibroids show restricted diffusion. -- Intracavitary fibroids:  None. --  Pedunculated fibroids: None. -- Fibroid contrast enhancement: All fibroids show contrast enhancement, without significant degeneration/devascularization. -- Right ovary: Not well visualized, however no adnexal mass identified. -- Left ovary: Not well visualized, however no adnexal mass identified. Other: No abnormal free fluid. Musculoskeletal:  Unremarkable. IMPRESSION: Markedly enlarged uterus with fibroids measuring up to 14.5 cm, as described above. Multiple small subserosal fibroids are noted, but no pedunculated or intracavitary fibroids identified. No adnexal mass identified. Electronically Signed   By: JMarlaine HindM.D.   On: 09/21/2022 10:02    Labs:  CBC: Recent Labs    09/14/22 1609  WBC 7.0  HGB 11.6  HCT 37.7  PLT 240    COAGS: No results for input(s): "INR", "APTT" in the last 8760 hours.  BMP: Recent Labs    09/14/22 1609  NA 136  K 4.4  CL 102  CO2 21  GLUCOSE 81  BUN 11  CALCIUM 9.5  CREATININE 0.84    LIVER FUNCTION TESTS: Recent Labs    09/14/22 1609  BILITOT 0.3  AST 16  ALT 13  ALKPHOS 54  PROT 7.2  ALBUMIN 4.3    TUMOR MARKERS: No results for input(s): "AFPTM", "CEA", "CA199", "CHROMGRNA" in the last 8760 hours.  Assessment and Plan:  RSAVANHA RIXis a 52y.o.  female with history of myomectomy in 2008 who has been referred for evaluation of symptomatic uterine fibroids.   The patient has a long history of symptomatic uterine fibroids, evident by her history of myomectomy in 2008.   The patient's primary fibroid related complaint is in regards to menorrhagia, thought does report intermittent pelvic cramping at the time of her cycle and reports intermittent bloating.     Despite the patient being 52 years old, she denies any perimenopausal symptoms.  Personal review of pelvic MRI performed 09/21/2022 demonstrates an enlarged and myomatous uterus with several heterogeneously enhancing uterine fibroids, with dominant fibroid within the left  side of the uterine body/fundus measuring approximately 14.5 x 10.6 x 10.0 cm.  No discrete adnexal lesions.  Imaging findings were discussed in detail with the patient.   Again, prolonged conversations were held with the patient regarding potential treatment options for her symptomatic uterine fibroids including continued conservative management and hysterectomy.  At the present time, the patient wishes to undergo intervention though wishes to avoid surgery.  As such, prolonged conversations were held with the patient regarding the benefits and risks (including but not limited to bleeding, vessel injury, nontarget embolization, infection, contrast and radiation exposure) of uterine fibroid embolization. Following this prolonged and detailed conversation, the patient wishes to pursue UFE.   (Note, the patient had a negative Pap smear on 09/14/2022).   Uterine fibroid embolization will be scheduled at [facility].  The procedure will be performed with conscious sedation though will entail an overnight admission for observation and PCA usage.    Plan: - Schedule uterine fibroid embolization at [facility].  The procedure will be performed with conscious sedation though will entail an overnight admission for observation and PCA usage   Thank you for this interesting consult.  I greatly enjoyed meeting LENKA JURS and look forward to participating in their care.  A copy of this report was sent to the requesting provider on this date.  Electronically Signed: Sandi Mariscal 09/29/2022, 8:04 AM   I spent a total of 15 Minutes in remote  clinical consultation, greater than 50% of which was counseling/coordinating care for symptomatic uterine fibroids.    Visit type: Audio only (telephone). Audio (no video) only due to patient's lack of internet/smartphone capability. Alternative for in-person consultation at [facility], [address] [place], [place], [place]. This visit type was  conducted due to national recommendations for restrictions regarding the COVID-19 Pandemic (e.g. social distancing).  This format is felt to be most appropriate for this patient at this time.  All issues noted in this document were discussed and addressed.

## 2022-10-27 ENCOUNTER — Other Ambulatory Visit: Payer: Self-pay | Admitting: Interventional Radiology

## 2022-10-27 DIAGNOSIS — D250 Submucous leiomyoma of uterus: Secondary | ICD-10-CM

## 2022-10-28 ENCOUNTER — Telehealth (HOSPITAL_COMMUNITY): Payer: Self-pay

## 2022-10-28 ENCOUNTER — Other Ambulatory Visit: Payer: Self-pay | Admitting: Radiology

## 2022-10-28 DIAGNOSIS — D259 Leiomyoma of uterus, unspecified: Secondary | ICD-10-CM

## 2022-10-28 NOTE — Telephone Encounter (Signed)
-----   Message from Arne Cleveland, MD sent at 10/27/2022 12:19 PM EDT ----- Regarding: RE: Schedule Uterine Fibroid Embolization -  Conscious Sedation Yes of course 1 hr room time Radial access, long 53f catheter, long hi flow microcatheter, red embospheres  Thx DDH   ----- Message ----- From: Chad Cordial Sent: 10/27/2022   9:14 AM EDT To: Arne Cleveland, MD; Joanell Rising Subject: FW: Schedule Uterine Fibroid Embolization - #  Vernard Gambles,   This pt is wanting her UFE done this week and we are unable to get Dr. Pascal Lux here at [facility]. Anderson Malta asked Pascal Lux and he is fine with someone else doing. Can I put this on for you Friday?  If so, how much time will you need and any special supplies?  Thanks,  Lia Foyer ----- Message ----- From: Marcelyn Bruins Sent: 10/02/2022  12:37 PM EDT To: Joanell Rising; Chad Cordial Subject: Schedule Uterine Fibroid Embolization -  Con#  Interventional Radiology Procedure Request  Name: Stephanie [name] 13-Feb-1971  Procedure: Uterine Fibroid Embolization  Reason for procedure: Uterine Fibroids  Relevant History: In epic  Order Physician: Ackworth- no precert req per Margurite Auerbach ref# [id number] 10/02/2022 12:33 ##She would like to be scheduled As Soon As Possible## Thank you

## 2022-10-29 ENCOUNTER — Other Ambulatory Visit: Payer: Self-pay | Admitting: Student

## 2022-10-30 ENCOUNTER — Encounter (HOSPITAL_COMMUNITY): Payer: Self-pay | Admitting: Interventional Radiology

## 2022-10-30 ENCOUNTER — Other Ambulatory Visit: Payer: Self-pay | Admitting: Interventional Radiology

## 2022-10-30 ENCOUNTER — Encounter (HOSPITAL_COMMUNITY): Payer: Self-pay

## 2022-10-30 ENCOUNTER — Observation Stay (HOSPITAL_COMMUNITY)
Admission: RE | Admit: 2022-10-30 | Discharge: 2022-10-31 | Disposition: A | Discharge status: Home / Self Care | Payer: BLUE CROSS/BLUE SHIELD | Source: Other Acute Inpatient Hospital | Attending: Interventional Radiology | Admitting: Interventional Radiology

## 2022-10-30 ENCOUNTER — Ambulatory Visit (HOSPITAL_COMMUNITY)
Admission: RE | Admit: 2022-10-30 | Discharge: 2022-10-30 | Disposition: A | Discharge status: Home / Self Care | Payer: BLUE CROSS/BLUE SHIELD | Source: Ambulatory Visit | Attending: Interventional Radiology | Admitting: Interventional Radiology

## 2022-10-30 ENCOUNTER — Other Ambulatory Visit: Payer: Self-pay

## 2022-10-30 DIAGNOSIS — D259 Leiomyoma of uterus, unspecified: Secondary | ICD-10-CM | POA: Insufficient documentation

## 2022-10-30 DIAGNOSIS — Z87891 Personal history of nicotine dependence: Secondary | ICD-10-CM | POA: Diagnosis not present

## 2022-10-30 DIAGNOSIS — D250 Submucous leiomyoma of uterus: Secondary | ICD-10-CM

## 2022-10-30 DIAGNOSIS — D219 Benign neoplasm of connective and other soft tissue, unspecified: Secondary | ICD-10-CM | POA: Diagnosis present

## 2022-10-30 HISTORY — PX: IR US GUIDE VASC ACCESS LEFT: IMG2389

## 2022-10-30 HISTORY — PX: IR ANGIOGRAM SELECTIVE EACH ADDITIONAL VESSEL: IMG667

## 2022-10-30 HISTORY — PX: IR ANGIOGRAM PELVIS SELECTIVE OR SUPRASELECTIVE: IMG661

## 2022-10-30 HISTORY — PX: IR EMBO ARTERIAL NOT HEMORR HEMANG INC GUIDE ROADMAPPING: IMG5448

## 2022-10-30 LAB — CBC WITH DIFFERENTIAL/PLATELET
Abs Immature Granulocytes: 0.01 10*3/uL (ref 0.00–0.07)
Abs Immature Granulocytes: 0.02 10*3/uL (ref 0.00–0.07)
Basophils Absolute: 0.1 10*3/uL (ref 0.0–0.1)
Basophils Absolute: 0.1 10*3/uL (ref 0.0–0.1)
Basophils Relative: 1 %
Basophils Relative: 1 %
Eosinophils Absolute: 0.1 10*3/uL (ref 0.0–0.5)
Eosinophils Absolute: 0.1 10*3/uL (ref 0.0–0.5)
Eosinophils Relative: 2 %
Eosinophils Relative: 1 %
HCT: 36.8 % (ref 36.0–46.0)
HCT: 35.1 % — ABNORMAL LOW (ref 36.0–46.0)
Hemoglobin: 12 g/dL (ref 12.0–15.0)
Hemoglobin: 11.4 g/dL — ABNORMAL LOW (ref 12.0–15.0)
Immature Granulocytes: 0 %
Immature Granulocytes: 0 %
Lymphocytes Relative: 28 %
Lymphocytes Relative: 27 %
Lymphs Abs: 1.7 10*3/uL (ref 0.7–4.0)
Lymphs Abs: 1.6 10*3/uL (ref 0.7–4.0)
MCH: 24.6 pg — ABNORMAL LOW (ref 26.0–34.0)
MCH: 24.4 pg — ABNORMAL LOW (ref 26.0–34.0)
MCHC: 32.6 g/dL (ref 30.0–36.0)
MCHC: 32.5 g/dL (ref 30.0–36.0)
MCV: 75.4 fL — ABNORMAL LOW (ref 80.0–100.0)
MCV: 75.2 fL — ABNORMAL LOW (ref 80.0–100.0)
Monocytes Absolute: 0.5 10*3/uL (ref 0.1–1.0)
Monocytes Absolute: 0.5 10*3/uL (ref 0.1–1.0)
Monocytes Relative: 8 %
Monocytes Relative: 8 %
Neutro Abs: 3.8 10*3/uL (ref 1.7–7.7)
Neutro Abs: 3.7 10*3/uL (ref 1.7–7.7)
Neutrophils Relative %: 61 %
Neutrophils Relative %: 63 %
Platelets: 317 10*3/uL (ref 150–400)
Platelets: 235 10*3/uL (ref 150–400)
RBC: 4.88 MIL/uL (ref 3.87–5.11)
RBC: 4.67 MIL/uL (ref 3.87–5.11)
RDW: 16.7 % — ABNORMAL HIGH (ref 11.5–15.5)
RDW: 16.4 % — ABNORMAL HIGH (ref 11.5–15.5)
WBC: 6.1 10*3/uL (ref 4.0–10.5)
WBC: 5.8 10*3/uL (ref 4.0–10.5)
nRBC: 0 % (ref 0.0–0.2)
nRBC: 0 % (ref 0.0–0.2)

## 2022-10-30 LAB — BASIC METABOLIC PANEL
Anion gap: 8 (ref 5–15)
BUN: 10 mg/dL (ref 6–20)
CO2: 24 mmol/L (ref 22–32)
Calcium: 8.9 mg/dL (ref 8.9–10.3)
Chloride: 105 mmol/L (ref 98–111)
Creatinine, Ser: 0.9 mg/dL (ref 0.44–1.00)
GFR, Estimated: >60 mL/min (ref >60)
Glucose, Bld: 96 mg/dL (ref 70–99)
Potassium: 4 mmol/L (ref 3.5–5.1)
Sodium: 137 mmol/L (ref 135–145)

## 2022-10-30 LAB — PROTIME-INR
INR: 1.1 (ref 0.8–1.2)
Prothrombin Time: 14 seconds (ref 11.4–15.2)

## 2022-10-30 MED ORDER — NITROGLYCERIN 1 MG/10 ML FOR IR/CATH LAB
INTRA_ARTERIAL | Status: AC
  Filled 2022-10-30: qty 10

## 2022-10-30 MED ORDER — HEPARIN SODIUM (PORCINE) 1000 UNIT/ML IJ SOLN
INTRAMUSCULAR | Status: AC
  Filled 2022-10-30: qty 10

## 2022-10-30 MED ORDER — PROMETHAZINE HCL 25 MG PO TABS
25.0000 mg | ORAL_TABLET | Freq: Three times a day (TID) | ORAL | Status: DC | PRN
  Administered 2022-10-30: 25 mg via ORAL
  Filled 2022-10-30: qty 1

## 2022-10-30 MED ORDER — SODIUM CHLORIDE 0.9% FLUSH: 3.0000 mL | Freq: Two times a day (BID) | INTRAVENOUS | Status: DC

## 2022-10-30 MED ORDER — FENTANYL CITRATE (PF) 100 MCG/2ML IJ SOLN
INTRAMUSCULAR | Status: AC
  Filled 2022-10-30: qty 4

## 2022-10-30 MED ORDER — HYDROMORPHONE 1 MG/ML IV SOLN
INTRAVENOUS | Status: DC
  Filled 2022-10-30: qty 30

## 2022-10-30 MED ORDER — CEFAZOLIN SODIUM-DEXTROSE 2-4 GM/100ML-% IV SOLN: 2.0000 g | INTRAVENOUS | Status: AC

## 2022-10-30 MED ORDER — IOHEXOL 300 MG/ML  SOLN
150.0000 mL | Freq: Once | INTRAMUSCULAR | Status: AC | PRN
  Administered 2022-10-30: 70 mL via INTRA_ARTERIAL

## 2022-10-30 MED ORDER — PROMETHAZINE HCL 25 MG RE SUPP: 25.0000 mg | Freq: Three times a day (TID) | RECTAL | Status: DC | PRN

## 2022-10-30 MED ORDER — LIDOCAINE-EPINEPHRINE 1 %-1:100000 IJ SOLN
INTRAMUSCULAR | Status: AC
  Administered 2022-10-30: 10 mL
  Filled 2022-10-30: qty 1

## 2022-10-30 MED ORDER — DOCUSATE SODIUM 100 MG PO CAPS
100.0000 mg | ORAL_CAPSULE | Freq: Two times a day (BID) | ORAL | Status: DC
  Administered 2022-10-30 – 2022-10-31 (×2): 100 mg via ORAL
  Filled 2022-10-30 (×2): qty 1

## 2022-10-30 MED ORDER — MIDAZOLAM HCL 2 MG/2ML IJ SOLN
INTRAMUSCULAR | Status: AC
  Filled 2022-10-30: qty 4

## 2022-10-30 MED ORDER — NALOXONE HCL 0.4 MG/ML IJ SOLN: 0.4000 mg | INTRAMUSCULAR | Status: DC | PRN

## 2022-10-30 MED ORDER — DIPHENHYDRAMINE HCL 50 MG/ML IJ SOLN: 12.5000 mg | Freq: Four times a day (QID) | INTRAMUSCULAR | Status: DC | PRN

## 2022-10-30 MED ORDER — IBUPROFEN 200 MG PO TABS
800.0000 mg | ORAL_TABLET | Freq: Four times a day (QID) | ORAL | Status: DC
  Administered 2022-10-31: 800 mg via ORAL
  Filled 2022-10-30 (×2): qty 4

## 2022-10-30 MED ORDER — SODIUM CHLORIDE 0.9% FLUSH: 9.0000 mL | INTRAVENOUS | Status: DC | PRN

## 2022-10-30 MED ORDER — DIPHENHYDRAMINE HCL 12.5 MG/5ML PO ELIX: 12.5000 mg | ORAL_SOLUTION | Freq: Four times a day (QID) | ORAL | Status: DC | PRN

## 2022-10-30 MED ORDER — CHLORHEXIDINE GLUCONATE CLOTH 2 % EX PADS
6.0000 | MEDICATED_PAD | Freq: Every day | CUTANEOUS | Status: DC
  Administered 2022-10-30: 6 via TOPICAL

## 2022-10-30 MED ORDER — CEFAZOLIN SODIUM-DEXTROSE 2-4 GM/100ML-% IV SOLN
INTRAVENOUS | Status: AC
  Administered 2022-10-30: 2 g via INTRAVENOUS
  Filled 2022-10-30: qty 100

## 2022-10-30 MED ORDER — VERAPAMIL HCL 2.5 MG/ML IV SOLN: INTRA_ARTERIAL | Status: AC | PRN

## 2022-10-30 MED ORDER — HYDROMORPHONE 1 MG/ML IV SOLN: INTRAVENOUS | Status: DC

## 2022-10-30 MED ORDER — SODIUM CHLORIDE 0.9 % IV SOLN: INTRAVENOUS | Status: DC

## 2022-10-30 MED ORDER — ONDANSETRON HCL 4 MG/2ML IJ SOLN: 4.0000 mg | Freq: Four times a day (QID) | INTRAMUSCULAR | Status: DC | PRN

## 2022-10-30 MED ORDER — ONDANSETRON HCL 4 MG/2ML IJ SOLN
4.0000 mg | Freq: Four times a day (QID) | INTRAMUSCULAR | Status: DC | PRN
  Administered 2022-10-30: 4 mg via INTRAVENOUS
  Filled 2022-10-30: qty 2

## 2022-10-30 MED ORDER — SODIUM CHLORIDE 0.9 % IV SOLN: 250.0000 mL | INTRAVENOUS | Status: DC | PRN

## 2022-10-30 MED ORDER — MIDAZOLAM HCL 2 MG/2ML IJ SOLN
INTRAMUSCULAR | Status: AC | PRN
  Administered 2022-10-30: .5 mg via INTRAVENOUS
  Administered 2022-10-30 (×3): 1 mg via INTRAVENOUS
  Administered 2022-10-30: .5 mg via INTRAVENOUS

## 2022-10-30 MED ORDER — KETOROLAC TROMETHAMINE 30 MG/ML IJ SOLN
30.0000 mg | INTRAMUSCULAR | Status: AC
  Administered 2022-10-30: 30 mg via INTRAVENOUS
  Filled 2022-10-30: qty 1

## 2022-10-30 MED ORDER — IOHEXOL 300 MG/ML  SOLN
50.0000 mL | Freq: Once | INTRAMUSCULAR | Status: AC | PRN
  Administered 2022-10-30: 20 mL via INTRA_ARTERIAL

## 2022-10-30 MED ORDER — VERAPAMIL HCL 2.5 MG/ML IV SOLN
INTRAVENOUS | Status: AC
  Filled 2022-10-30: qty 2

## 2022-10-30 MED ORDER — ONDANSETRON HCL 4 MG/2ML IJ SOLN
8.0000 mg | INTRAMUSCULAR | Status: AC
  Administered 2022-10-30: 8 mg via INTRAVENOUS
  Filled 2022-10-30: qty 4

## 2022-10-30 MED ORDER — IOHEXOL 300 MG/ML  SOLN
100.0000 mL | Freq: Once | INTRAMUSCULAR | Status: AC | PRN
  Administered 2022-10-30: 10 mL via INTRA_ARTERIAL

## 2022-10-30 MED ORDER — FENTANYL CITRATE (PF) 100 MCG/2ML IJ SOLN
INTRAMUSCULAR | Status: AC | PRN
  Administered 2022-10-30: 50 ug via INTRAVENOUS
  Administered 2022-10-30 (×6): 25 ug via INTRAVENOUS

## 2022-10-30 MED ORDER — SODIUM CHLORIDE 0.9% FLUSH: 3.0000 mL | INTRAVENOUS | Status: DC | PRN

## 2022-10-30 NOTE — Plan of Care (Signed)
  Problem: Education: Goal: Knowledge of General Education information will improve Description: Including pain rating scale, medication(s)/side effects and non-pharmacologic comfort measures Outcome: Progressing   Problem: Health Behavior/Discharge Planning: Goal: Ability to manage health-related needs will improve Outcome: Progressing   Problem: Clinical Measurements: Goal: Ability to maintain clinical measurements within normal limits will improve Outcome: Progressing Goal: Will remain free from infection Outcome: Progressing Goal: Diagnostic test results will improve Outcome: Progressing Goal: Respiratory complications will improve Outcome: Progressing Goal: Cardiovascular complication will be avoided Outcome: Progressing   Problem: Activity: Goal: Risk for activity intolerance will decrease Outcome: Progressing   Problem: Pain Managment: Goal: General experience of comfort will improve Outcome: Progressing   

## 2022-10-30 NOTE — H&P (Signed)
Chief Complaint: Patient was seen in consultation today for symptomatic uterine fibroids  Referring Physician(s): Dr. Mora Bellman  Supervising Physician: Arne Cleveland  Patient Status: [name] - Out-[facility]  History of Present Illness: Stephanie [name] is a 52 y.o. female with history menorrhagia s/p myomectomy in 2008 who remains symptomatic  with excessive bleeding, intermittent bloating, and cramping.  She was seen in consult with Dr. Ronny Bacon 09/17/2022 to discuss treatment and management related to her fibroid.  After discussion she elects to proceed with uterine artery embolization.   [name] presents for procedure today.  She has been NPO.  She does not take blood thinners.  She is aware of plans for admission for observation and pain control.  She denies new or concerning symptoms.  She is agreeable to proceed.   Past Medical History:  Diagnosis Date   Fibroids     Past Surgical History:  Procedure Laterality Date   MYOMECTOMY  2008    Allergies: Codeine  Medications: Prior to Admission medications   Medication Sig Start Date End Date Taking? Authorizing Provider  ibuprofen (ADVIL) 800 MG tablet Take 800 mg by mouth every 8 (eight) hours as needed.   Yes [provider]  ibuprofen (ADVIL) 800 MG tablet Take 1 tablet (800 mg total) by mouth every 8 (eight) hours as needed. 09/14/22   Constant, Peggy, MD     Family History  Problem Relation Age of Onset   Hypertension Mother    Diabetes Mother    Breast cancer Neg Hx     Social History   Socioeconomic History   Marital status: Single    Spouse name: Not on file   Number of children: Not on file   Years of education: Not on file   Highest education level: Not on file  Occupational History   Not on file  Tobacco Use   Smoking status: Former    Types: Cigarettes   Smokeless tobacco: Former  Scientific laboratory technician Use: Never used  Substance and Sexual Activity   Alcohol use: Yes   Drug use: Not  Currently   Sexual activity: Not Currently    Birth control/protection: None  Other Topics Concern   Not on file  Social History Narrative   Not on file   Social Determinants of Health   Financial Resource Strain: Not on file  Food Insecurity: Not on file  Transportation Needs: Not on file  Physical Activity: Not on file  Stress: Not on file  Social Connections: Not on file     Review of Systems: A 12 point ROS discussed and pertinent positives are indicated in the HPI above.  All other systems are negative.  Review of Systems  Constitutional:  Negative for fatigue and fever.  Respiratory:  Negative for cough and shortness of breath.   Cardiovascular:  Negative for chest pain.  Gastrointestinal:  Negative for abdominal distention, nausea and vomiting.  Genitourinary:  Negative for dysuria.  Musculoskeletal:  Negative for back pain.  Psychiatric/Behavioral:  Negative for behavioral problems and confusion.     Vital Signs: BP 114/74   Pulse 81   Temp 98.6 F (37 C) (Oral)   Resp 14   Ht 6' (1.829 m)   Wt 222 lb (100.7 kg)   LMP 08/26/2022   SpO2 96%   BMI 30.11 kg/m   Physical Exam Vitals and nursing note reviewed.  Constitutional:      General: She is not in acute distress.  Appearance: Normal appearance. She is not ill-appearing.  HENT:     Mouth/Throat:     Mouth: Mucous membranes are moist.     Pharynx: Oropharynx is clear.  Cardiovascular:     Rate and Rhythm: Normal rate and regular rhythm.  Pulmonary:     Effort: Pulmonary effort is normal. No respiratory distress.     Breath sounds: Normal breath sounds.  Abdominal:     General: Abdomen is flat.     Palpations: Abdomen is soft.  Skin:    General: Skin is warm and dry.  Neurological:     General: No focal deficit present.     Mental Status: She is alert and oriented to person, place, and time. Mental status is at baseline.  Psychiatric:        Mood and Affect: Mood normal.        Behavior:  Behavior normal.        Thought Content: Thought content normal.        Judgment: Judgment normal.      MD Evaluation Airway: WNL Heart: WNL Abdomen: WNL Chest/ Lungs: WNL ASA  Classification: 3   Imaging: No results found.  Labs:  CBC: Recent Labs    09/14/22 1609 10/30/22 0911  WBC 7.0 6.1  HGB 11.6 12.0  HCT 37.7 36.8  PLT 240 317    COAGS: No results for input(s): "INR", "APTT" in the last 8760 hours.  BMP: Recent Labs    09/14/22 1609  NA 136  K 4.4  CL 102  CO2 21  GLUCOSE 81  BUN 11  CALCIUM 9.5  CREATININE 0.84    LIVER FUNCTION TESTS: Recent Labs    09/14/22 1609  BILITOT 0.3  AST 16  ALT 13  ALKPHOS 54  PROT 7.2  ALBUMIN 4.3    TUMOR MARKERS: No results for input(s): "AFPTM", "CEA", "CA199", "CHROMGRNA" in the last 8760 hours.  Assessment and Plan: Patient with no significant past medical history presents with complaint of symptomatic uterine fibroid.  IR consulted for uterine artery embolization at the request of Dr. Elly Modena. Case reviewed by Dr. Pascal Lux who approves patient for procedure.  Patient presents today in their usual state of health.  She has been NPO and is not currently on blood thinners. She is aware Dr. Vernard Gambles is the IR attending today who will be performing the procedure and has no related concerns.  She is aware of plans for observation overnight and has made necessary arrangements at home.   The Risks and benefits of embolization were discussed with the patient including, but not limited to bleeding, infection, vascular injury, post operative pain, or contrast induced renal failure.  This procedure involves the use of X-rays and because of the nature of the planned procedure, it is possible that we will have prolonged use of X-ray fluoroscopy.  Potential radiation risks to you include (but are not limited to) the following: - A slightly elevated risk for cancer several years later in life. This risk is typically  less than 0.5% percent. This risk is low in comparison to the normal incidence of human cancer, which is 33% for women and 50% for men according to the [place]. - Radiation induced injury can include skin redness, resembling a rash, tissue breakdown / ulcers and hair loss (which can be temporary or permanent).   The likelihood of either of these occurring depends on the difficulty of the procedure and whether you are sensitive to radiation due to previous  procedures, disease, or genetic conditions.   IF your procedure requires a prolonged use of radiation, you will be notified and given written instructions for further action.  It is your responsibility to monitor the irradiated area for the 2 weeks following the procedure and to notify your physician if you are concerned that you have suffered a radiation induced injury.    All of the patient's questions were answered, patient is agreeable to proceed. Consent signed and in chart.  Thank you for this interesting consult.  I greatly enjoyed meeting MAURY GUERINO and look forward to participating in their care.  A copy of this report was sent to the requesting provider on this date.  Electronically Signed: Docia Barrier, PA 10/30/2022, 10:15 AM   I spent a total of  30 Minutes   in face to face in clinical consultation, greater than 50% of which was counseling/coordinating care for symptomatic fibroids.

## 2022-10-30 NOTE — Progress Notes (Signed)
Refused to eat dinner, claiming that her abdomen is bothering her. Continue to monitor.

## 2022-10-30 NOTE — Progress Notes (Signed)
CHG wipes and foley/peri care done by NT.

## 2022-10-30 NOTE — H&P (Signed)
  The note originally documented on this encounter has been moved the the encounter in which it belongs.  

## 2022-10-30 NOTE — Progress Notes (Signed)
TR Band removed, 2x2 gauze applied with tegaderm. No bleeding noted. Continue to monitor.

## 2022-10-30 NOTE — Procedures (Signed)
  Procedure:  Bilateral uterine artery embolization via L radial Preprocedure diagnosis: Diagnoses of Submucous leiomyoma of uterus and Uterine leiomyoma, unspecified location were pertinent to this visit. Postprocedure diagnosis: same EBL:    minimal Complications:   none immediate  See full dictation in [employer].  Dillard Cannon MD Main # [phone] Pager  [phone] Mobile [phone]

## 2022-10-31 ENCOUNTER — Other Ambulatory Visit: Payer: Self-pay | Admitting: Physician Assistant

## 2022-10-31 DIAGNOSIS — D219 Benign neoplasm of connective and other soft tissue, unspecified: Secondary | ICD-10-CM | POA: Diagnosis present

## 2022-10-31 DIAGNOSIS — D259 Leiomyoma of uterus, unspecified: Principal | ICD-10-CM | POA: Diagnosis present

## 2022-10-31 MED ORDER — ONDANSETRON HCL 4 MG PO TABS: 4.0000 mg | ORAL_TABLET | Freq: Three times a day (TID) | ORAL | 0 refills | Status: DC | PRN

## 2022-10-31 MED ORDER — OXYCODONE HCL 5 MG/5ML PO SOLN: 5.0000 mg | ORAL | Status: DC | PRN

## 2022-10-31 MED ORDER — OXYCODONE HCL 5 MG PO TABS
5.0000 mg | ORAL_TABLET | ORAL | Status: DC | PRN
  Administered 2022-10-31: 5 mg via ORAL
  Filled 2022-10-31: qty 1

## 2022-10-31 MED ORDER — PREDNISONE 10 MG (21) PO TBPK: ORAL_TABLET | ORAL | 0 refills | Status: DC

## 2022-10-31 MED ORDER — OXYCODONE-ACETAMINOPHEN 7.5-325 MG PO TABS: 1.0000 | ORAL_TABLET | Freq: Four times a day (QID) | ORAL | 0 refills | Status: DC | PRN

## 2022-10-31 NOTE — TOC Transition Note (Signed)
Transition of Care [facility]) - CM/SW Discharge Note   Patient Details  Name: Stephanie [name] MRN: [id number] Date of Birth: 08-Nov-1970  Transition of Care [facility]) CM/SW Contact:  Tom-Johnson, Renea Ee, RN Phone Number: 10/31/2022, 12:12 PM   Clinical Narrative:     Patient is scheduled for discharge today. Readmission Prevention Assessment done.  Hospital f/u and discharge instructions on AVS.  No TOC needs or recommendations noted.    Family to transport at discharge. No further TOC needs noted.   Final next level of care: Home/Self Care Barriers to Discharge: Barriers Resolved   Patient Goals and CMS Choice CMS Medicare.gov Compare Post Acute Care list provided to:: Patient Choice offered to / list presented to : NA  Discharge Placement                  Patient to be transferred to facility by: Family      Discharge Plan and Services Additional resources added to the After Visit Summary for                  DME Arranged: N/A DME Agency: NA       HH Arranged: NA HH Agency: NA        Social Determinants of Health ([place]) Interventions SDOH Screenings   Food Insecurity: No Food Insecurity (10/30/2022)  Housing: Low Risk  (10/30/2022)  Transportation Needs: No Transportation Needs (10/30/2022)  Utilities: Not At Risk (10/30/2022)  Tobacco Use: Medium Risk (10/30/2022)     Readmission Risk Interventions    10/31/2022   12:11 PM  Readmission Risk Prevention Plan  Post Dischage Appt Complete  Medication Screening Complete  Transportation Screening Complete

## 2022-10-31 NOTE — Discharge Summary (Signed)
    Patient ID: DRAYA HAFT MRN: [id number] DOB/AGE: Mar 20, 1971 52 y.o.  Admit date: 10/30/2022 Discharge date: 10/31/2022  Supervising Physician: Arne Cleveland  Patient Status: [facility] - In-pt  Admission Diagnoses: Symptomatic uterine fibroids  Discharge Diagnoses:   Symptomatic Uterine fibroids  Discharged Condition: good  Hospital Course:   CAMBRIDGE HAYMON is a 52 y.o. female with history menorrhagia s/p myomectomy in 2008 who remains symptomatic  with excessive bleeding, intermittent bloating, and cramping.    She was seen in consult with Dr. Ronny Bacon 09/17/2022 to discuss treatment and management related to her fibroid.    After discussion she elected to proceed with uterine artery embolization.   She underwent the procedure yesterday and has done very well post procedure.  PCA has been discontinued and her pain is currently managed with oral medications.  Foley has been discontinued.  She is tolerating a diet. She has ambulated with no issue.  She is ready for discharge.    Consults: None   Treatments:   Bilateral uterine artery embolization via left radial artery access.  Discharge Exam: Blood pressure 138/78, pulse 62, temperature 97.8 F (36.6 C), temperature source Oral, resp. rate 16, last menstrual period 08/26/2022, SpO2 98 %.  Alert and oriented. No distress Heart RRR Lungs clear Abdomen soft Left radial artery access site looks good, no hematoma/pseudoaneurysm  Disposition: Discharge disposition: 01-Home or Self Care       Discharge to home  Follow up with Dr. Vernard Gambles in 2-3 weeks.  IR schedulers will call patient with appointment details. Discharge Instructions     Call MD for:  persistant nausea and vomiting   Complete by: As directed    Call MD for:  redness, tenderness, or signs of infection (pain, swelling, redness, odor or green/yellow discharge around incision site)   Complete by: As directed    Call MD for:  severe  uncontrolled pain   Complete by: As directed    Call MD for:  temperature >100.4   Complete by: As directed    Diet general   Complete by: As directed    Increase activity slowly   Complete by: As directed          Electronically Signed: Murrell Redden, PA-C 10/31/2022, 10:49 AM     I have spent Greater Than 30 Minutes discharging Marya Amsler.

## 2022-10-31 NOTE — Progress Notes (Signed)
20 ml of Dilaudid wasted with Zenia Resides, RN

## 2022-10-31 NOTE — Plan of Care (Signed)

## 2022-11-05 ENCOUNTER — Other Ambulatory Visit: Payer: Self-pay | Admitting: Interventional Radiology

## 2022-11-05 DIAGNOSIS — D250 Submucous leiomyoma of uterus: Secondary | ICD-10-CM

## 2023-04-23 ENCOUNTER — Other Ambulatory Visit (HOSPITAL_COMMUNITY): Payer: Self-pay

## 2023-09-07 ENCOUNTER — Other Ambulatory Visit: Payer: Self-pay | Admitting: Obstetrics and Gynecology

## 2023-09-07 DIAGNOSIS — Z1231 Encounter for screening mammogram for malignant neoplasm of breast: Secondary | ICD-10-CM

## 2023-09-21 ENCOUNTER — Ambulatory Visit: Payer: BLUE CROSS/BLUE SHIELD

## 2023-09-22 ENCOUNTER — Ambulatory Visit: Payer: BLUE CROSS/BLUE SHIELD

## 2023-10-11 ENCOUNTER — Ambulatory Visit (INDEPENDENT_AMBULATORY_CARE_PROVIDER_SITE_OTHER): Payer: Self-pay | Admitting: Obstetrics and Gynecology

## 2023-10-11 ENCOUNTER — Encounter: Payer: Self-pay | Admitting: Obstetrics and Gynecology

## 2023-10-11 VITALS — BP 118/72 | HR 68 | Temp 98.6°F | Ht 72.0 in | Wt 219.0 lb

## 2023-10-11 DIAGNOSIS — D251 Intramural leiomyoma of uterus: Secondary | ICD-10-CM

## 2023-10-11 DIAGNOSIS — Z01419 Encounter for gynecological examination (general) (routine) without abnormal findings: Secondary | ICD-10-CM | POA: Diagnosis not present

## 2023-10-11 DIAGNOSIS — Z1331 Encounter for screening for depression: Secondary | ICD-10-CM

## 2023-10-11 DIAGNOSIS — N644 Mastodynia: Secondary | ICD-10-CM

## 2023-10-11 DIAGNOSIS — N898 Other specified noninflammatory disorders of vagina: Secondary | ICD-10-CM

## 2023-10-11 DIAGNOSIS — Z1211 Encounter for screening for malignant neoplasm of colon: Secondary | ICD-10-CM

## 2023-10-11 DIAGNOSIS — N939 Abnormal uterine and vaginal bleeding, unspecified: Secondary | ICD-10-CM | POA: Diagnosis not present

## 2023-10-11 DIAGNOSIS — D250 Submucous leiomyoma of uterus: Secondary | ICD-10-CM

## 2023-10-11 LAB — WET PREP FOR TRICH, YEAST, CLUE

## 2023-10-11 NOTE — Progress Notes (Signed)
 53 y.o. G0P0000 female with fibroids (status post [place] 3/29/ 2024, myomectomy 2008, 20 cm uterus) here for new patient annual exam. Single.  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  Patient's last menstrual period was 09/05/2022 (exact date). Period Duration (Days): 5-7 Period Pattern: (!) Irregular Menstrual Flow: Light Menstrual Control: Panty liner Dysmenorrhea: (!) Mild  Pt reports having a [place] in 2024.  She has had significant improvement in her menstrual cycle since then.  Periods are very light only requiring a panty liner.  She has small blood clots with menses, sometimes this happens twice a month.  Skipped her cycles for the first time in December and January.  Normal cycle in February.  She also reports external vaginal irritation on occasions.  Using castor oil as needed.  09/21/2022 MRI pelvis: 20.0 x 11.5 x 14.9 cm uterus with 14 cm intramural fibroid with submucosal component 09/14/2022 TSH within normal limits 10/30/2022 CBC with hemoglobin of 11.4  Abnormal bleeding: As noted Pelvic discharge or pain: None Breast mass, nipple discharge or skin changes : Occasional tenderness of left breast nipple, none today Birth control: Abstinence Last PAP:     Component Value Date/Time   DIAGPAP  09/14/2022 1556    - Negative for intraepithelial lesion or malignancy (NILM)   HPVHIGH Negative 09/14/2022 1556   ADEQPAP  09/14/2022 1556    Satisfactory for evaluation; transformation zone component ABSENT.   Last mammogram: 09/17/2022 BI-RADS 1, density C Last colonoscopy: Never Sexually active: No Exercising: Yes, walking and strength training Smoker: No  GYN HISTORY: 2008 myomectomy 2024 [place]  OB History  Gravida Para Term Preterm AB Living  0 0 0 0 0 0  SAB IAB Ectopic Multiple Live Births  0 0 0 0 0    Past Medical History:  Diagnosis Date   Fibroids     Past Surgical History:  Procedure Laterality Date   IR ANGIOGRAM PELVIS SELECTIVE OR SUPRASELECTIVE  10/30/2022   IR ANGIOGRAM  SELECTIVE EACH ADDITIONAL VESSEL  10/30/2022   IR ANGIOGRAM SELECTIVE EACH ADDITIONAL VESSEL  10/30/2022   IR EMBO ARTERIAL NOT HEMORR HEMANG INC GUIDE ROADMAPPING  10/30/2022   IR US GUIDE VASC ACCESS LEFT  10/30/2022   MYOMECTOMY  2008    Current Outpatient Medications on File Prior to Visit  Medication Sig Dispense Refill   ibuprofen (ADVIL) 800 MG tablet Take 1 tablet (800 mg total) by mouth every 8 (eight) hours as needed. 60 tablet 1   No current facility-administered medications on file prior to visit.    Social History   Socioeconomic History   Marital status: Single    Spouse name: Not on file   Number of children: Not on file   Years of education: Not on file   Highest education level: Not on file  Occupational History   Not on file  Tobacco Use   Smoking status: Never   Smokeless tobacco: Never  Vaping Use   Vaping status: Never Used  Substance and Sexual Activity   Alcohol use: Yes   Drug use: Not Currently   Sexual activity: Not Currently    Birth control/protection: None  Other Topics Concern   Not on file  Social History Narrative   Not on file   Social Drivers of Health   Financial Resource Strain: Low Risk  (01/25/2023)   [facility]   Overall Financial Resource Strain (CARDIA)    Difficulty of Paying Living Expenses: Not hard at all  Food Insecurity: No Food Insecurity (01/25/2023)  Received from [facility] Vital Sign    Worried About Running Out of Food in the Last Year: Never true    Ran Out of Food in the Last Year: Never true  Transportation Needs: No Transportation Needs (01/25/2023)   Received from [facility] - Transportation    Lack of Transportation (Medical): No    Lack of Transportation (Non-Medical): No  Physical Activity: Not on file  Stress: Not on file  Social Connections: Unknown (01/12/2023)   Received from [facility]   Social Network    Social Network: Not on file  Intimate Partner  Violence: Unknown (01/12/2023)   Received from [facility]   HITS    Physically Hurt: Not on file    Insult or Talk Down To: Not on file    Threaten Physical Harm: Not on file    Scream or Curse: Not on file    Family History  Problem Relation Age of Onset   Hypertension Mother    Diabetes Mother    Breast cancer Maternal Aunt 60 - 69    Allergies  Allergen Reactions   Codeine Itching      PE Today's Vitals   10/11/23 1037  BP: 118/72  Pulse: 68  Temp: 98.6 F (37 C)  TempSrc: Oral  SpO2: 98%  Weight: 219 lb (99.3 kg)  Height: 6' (1.829 m)   Body mass index is 29.7 kg/m.  Physical Exam Vitals reviewed. Exam conducted with a chaperone present.  Constitutional:      General: She is not in acute distress.    Appearance: Normal appearance.  HENT:     Head: Normocephalic and atraumatic.     Nose: Nose normal.  Eyes:     Extraocular Movements: Extraocular movements intact.     Conjunctiva/sclera: Conjunctivae normal.  Neck:     Thyroid: No thyroid mass, thyromegaly or thyroid tenderness.  Pulmonary:     Effort: Pulmonary effort is normal.  Chest:     Chest wall: No mass or tenderness.  Breasts:    Right: Normal. No swelling, mass, nipple discharge, skin change or tenderness.     Left: Normal. No swelling, mass, nipple discharge, skin change or tenderness.  Abdominal:     General: There is no distension.     Palpations: Abdomen is soft. There is mass.     Tenderness: There is no abdominal tenderness.     Comments: Fibroids palpated at umbilicus  Genitourinary:    General: Normal vulva.     Exam position: Lithotomy position.     Urethra: No prolapse.     Vagina: Normal. No vaginal discharge or bleeding.     Cervix: Normal. No lesion.     Uterus: Normal. Enlarged. Not tender.      Adnexa: Right adnexa normal and left adnexa normal.     Comments: 20 cm multifibroid uterus Musculoskeletal:        General: Normal range of motion.     Cervical back: Normal  range of motion.  Lymphadenopathy:     Upper Body:     Right upper body: No axillary adenopathy.     Left upper body: No axillary adenopathy.     Lower Body: No right inguinal adenopathy. No left inguinal adenopathy.  Skin:    General: Skin is warm and dry.  Neurological:     General: No focal deficit present.     Mental Status: She is alert.  Psychiatric:  Mood and Affect: Mood normal.        Behavior: Behavior normal.       Assessment and Plan:        Well woman exam with routine gynecological exam Assessment & Plan: Cervical cancer screening performed according to ASCCP guidelines. Encouraged annual mammogram screening Colonoscopy -referral to GI placed  DXA N/A Labs and immunizations with her primary Encouraged safe sexual practices as indicated Encouraged healthy lifestyle practices with diet and exercise For patients under 50-70yo, I recommend 1200mg  calcium daily and 600IU of vitamin D daily.    Abnormal uterine bleeding (AUB) Intramural, submucous, and subserous leiomyoma of uterus Assessment & Plan: Significantly improved after [place] Given skipped menses recently will check FSH, TSH, PRL Will also check CBC Counseled that with perimenopause cycle should become lighter and less frequent.  However if she is having frequent or heavier bleeding, she should return for ultrasound or evaluation of her endometrial lining.  Orders: -     Thyroid Panel With TSH -     FSH/LH -     Prolactin -     CBC  Colon cancer screening -     Ambulatory referral to Gastroenterology  Vaginal discharge -     WET PREP FOR TRICH, YEAST, CLUE  Breast tenderness in female -     [place] LIMITED ULTRASOUND INCLUDING AXILLA LEFT BREAST ; Future -     MM 3D DIAGNOSTIC MAMMOGRAM UNILATERAL LEFT BREAST; Future   Rosalyn Gess, MD

## 2023-10-11 NOTE — Assessment & Plan Note (Signed)
 Cervical cancer screening performed according to ASCCP guidelines. Encouraged annual mammogram screening Colonoscopy -referral to GI placed  DXA N/A Labs and immunizations with her primary Encouraged safe sexual practices as indicated Encouraged healthy lifestyle practices with diet and exercise For patients under 50-54yo, I recommend 1200mg  calcium daily and 600IU of vitamin D daily.

## 2023-10-11 NOTE — Patient Instructions (Signed)

## 2023-10-11 NOTE — Assessment & Plan Note (Addendum)
 Significantly improved after [place] Given skipped menses recently will check [facility] Will also check CBC Counseled that with perimenopause cycle should become lighter and less frequent.  However if she is having frequent or heavier bleeding, she should return for ultrasound or evaluation of her endometrial lining.

## 2023-10-12 LAB — PROLACTIN: Prolactin: 7.1 ng/mL

## 2023-10-12 LAB — CBC
HCT: 41 % (ref 35.0–45.0)
Hemoglobin: 13.5 g/dL (ref 11.7–15.5)
MCH: 29 pg (ref 27.0–33.0)
MCHC: 32.9 g/dL (ref 32.0–36.0)
MCV: 88.2 fL (ref 80.0–100.0)
MPV: 11.4 fL (ref 7.5–12.5)
Platelets: 207 10*3/uL (ref 140–400)
RBC: 4.65 10*6/uL (ref 3.80–5.10)
RDW: 14.1 % (ref 11.0–15.0)
WBC: 5.2 10*3/uL (ref 3.8–10.8)

## 2023-10-12 LAB — FSH/LH
FSH: 13.5 m[IU]/mL
LH: 7.2 m[IU]/mL

## 2023-10-12 LAB — THYROID PANEL WITH TSH
Free Thyroxine Index: 1.6 (ref 1.4–3.8)
T3 Uptake: 30 % (ref 22–35)
T4, Total: 5.3 ug/dL (ref 5.1–11.9)
TSH: 1.15 m[IU]/L

## 2023-10-13 ENCOUNTER — Encounter: Payer: Self-pay | Admitting: Obstetrics and Gynecology

## 2023-11-18 LAB — HM COLONOSCOPY

## 2024-06-07 ENCOUNTER — Other Ambulatory Visit: Payer: Self-pay | Admitting: Obstetrics and Gynecology

## 2024-07-09 ENCOUNTER — Encounter (HOSPITAL_COMMUNITY): Payer: Self-pay | Admitting: *Deleted

## 2024-07-09 ENCOUNTER — Ambulatory Visit (HOSPITAL_COMMUNITY)
Admission: EM | Admit: 2024-07-09 | Discharge: 2024-07-09 | Disposition: A | Discharge status: Home / Self Care | Attending: Physician Assistant | Admitting: Physician Assistant

## 2024-07-09 ENCOUNTER — Other Ambulatory Visit: Payer: Self-pay

## 2024-07-09 DIAGNOSIS — S80862A Insect bite (nonvenomous), left lower leg, initial encounter: Secondary | ICD-10-CM

## 2024-07-09 DIAGNOSIS — W57XXXA Bitten or stung by nonvenomous insect and other nonvenomous arthropods, initial encounter: Secondary | ICD-10-CM

## 2024-07-09 DIAGNOSIS — L089 Local infection of the skin and subcutaneous tissue, unspecified: Secondary | ICD-10-CM

## 2024-07-09 DIAGNOSIS — Z2989 Encounter for other specified prophylactic measures: Secondary | ICD-10-CM

## 2024-07-09 MED ORDER — MUPIROCIN 2 % EX OINT: 1.0000 | TOPICAL_OINTMENT | Freq: Two times a day (BID) | CUTANEOUS | 0 refills | Status: AC

## 2024-07-09 MED ORDER — DOXYCYCLINE HYCLATE 100 MG PO CAPS: 100.0000 mg | ORAL_CAPSULE | Freq: Two times a day (BID) | ORAL | 0 refills | Status: AC

## 2024-07-09 MED ORDER — MEFLOQUINE HCL 250 MG PO TABS: 250.0000 mg | ORAL_TABLET | ORAL | 0 refills | Status: AC

## 2024-07-09 NOTE — Discharge Instructions (Signed)
 I have called in 2 more doses of your malaria prophylaxis as we discussed.  Take these every 7 days as previously prescribed.  I am concerned that this wound is infected.  Start doxycycline  100 mg twice daily for 10 days.  Stay out of the sun while on this medication.  Keep the wound clean with soap and water and apply Bactroban  ointment twice daily with dressing changes.  As we discussed, it is possible that this is a parasitic infection known as leishmaniasis.  If this is not healing appropriately or if it starts to look abnormal (an ulcer with raised edges) I would like you to follow-up with regional infectious disease.  I believe the swelling in your leg is related to the infection but if you have increasing swelling or pain please return to that we can order ultrasound to make sure that there is no blood clot since you just traveled.  If anything changes or worsens (additional wounds, change of the wound, spreading redness, pain in your leg, increasing swelling, chest pain, shortness of breath, heart racing, fever) you need to be seen immediately.

## 2024-07-09 NOTE — ED Provider Notes (Signed)
 [facility]    CSN: [id number] Arrival date & time: 07/09/24  9188      History   Chief Complaint Chief Complaint  Patient presents with   Insect Bite    HPI Stephanie [name] is a 53 y.o. female.   Patient presents today for evaluation of wound on her left lower leg.  She recently traveled to [place] and returned earlier today.  While she was over there she was bitten by insects and most of these wounds have essentially resolved but she has 1 on the back of her leg that has become erythematous, painful, with associated drainage.  She had been keeping the area clean and initially this bite was a bulla but that has since spontaneously drained and she is now experiencing purulent drainage with associated redness and pain.  The pain and redness is localized to the area around the wound and does not involve the rest of her lower leg.  She denies any additional symptoms including fever, nausea, vomiting.  She did get all of her recommended pretravel vaccinations and has been taking malaria prophylaxis.  She is requesting that we provide a refill of this (mefloquine ) as her bag has not yet arrived from travel and she is unsure if the medication will be available to her when her next dose is due.  She denies any significant past medical history including seizures or arrhythmias.  She has been taking this medication without any side effects or concerns.  She denies any recent antibiotics.  Denies history of recurrent skin infections or MRSA.  She has no concern for pregnancy.  She has been applying Desitin does not manage her symptoms because this is the only thing that she had access to while overseas which she does think has provided some improvement of symptoms.    Past Medical History:  Diagnosis Date   Fibroids     Patient Active Problem List   Diagnosis Date Noted   Well woman exam with routine gynecological exam 10/11/2023   Abnormal uterine bleeding (AUB) 10/11/2023    Fibroid uterus 10/31/2022   Fibroids 10/31/2022   Uterine fibroid 10/30/2022    Past Surgical History:  Procedure Laterality Date   IR ANGIOGRAM PELVIS SELECTIVE OR SUPRASELECTIVE  10/30/2022   IR ANGIOGRAM SELECTIVE EACH ADDITIONAL VESSEL  10/30/2022   IR ANGIOGRAM SELECTIVE EACH ADDITIONAL VESSEL  10/30/2022   IR EMBO ARTERIAL NOT HEMORR HEMANG INC GUIDE ROADMAPPING  10/30/2022   IR US  GUIDE VASC ACCESS LEFT  10/30/2022   MYOMECTOMY  2008    OB History     Gravida  0   Para  0   Term  0   Preterm  0   AB  0   Living  0      SAB  0   IAB  0   Ectopic  0   Multiple  0   Live Births  0            Home Medications    Prior to Admission medications   Medication Sig Start Date End Date Taking? Authorizing Provider  doxycycline  (VIBRAMYCIN ) 100 MG capsule Take 1 capsule (100 mg total) by mouth 2 (two) times daily. 07/09/24  Yes Kaori Jumper K, PA-C  ibuprofen  (ADVIL ) 800 MG tablet Take 1 tablet (800 mg total) by mouth every 8 (eight) hours as needed. 09/14/22  Yes Constant, Peggy, MD  mefloquine  (LARIAM ) 250 MG tablet Take 1 tablet (250 mg total) by mouth every 7 (  seven) days for 2 doses. 07/09/24 07/17/24 Yes Honestee Revard K, PA-C  mupirocin  ointment (BACTROBAN ) 2 % Apply 1 Application topically 2 (two) times daily. 07/09/24  Yes Suhey Radford, Rocky POUR, PA-C    Family History Family History  Problem Relation Age of Onset   Hypertension Mother    Diabetes Mother    Breast cancer Maternal Aunt 17 - 96    Social History Social History   Tobacco Use   Smoking status: Never   Smokeless tobacco: Never  Vaping Use   Vaping status: Never Used  Substance Use Topics   Alcohol use: Yes   Drug use: Not Currently     Allergies   Codeine   Review of Systems Review of Systems  Constitutional:  Negative for activity change, appetite change, fatigue and fever.  Respiratory:  Negative for shortness of breath.   Cardiovascular:  Negative for chest pain, palpitations and  leg swelling (localized to wound only, not entire leg).  Gastrointestinal:  Negative for diarrhea, nausea and vomiting.  Musculoskeletal:  Negative for arthralgias and myalgias.  Skin:  Positive for color change and wound.  Neurological:  Negative for dizziness, weakness, numbness and headaches.     Physical Exam Triage Vital Signs ED Triage Vitals  Encounter Vitals Group     BP 07/09/24 0907 112/72     Girls Systolic BP Percentile --      Girls Diastolic BP Percentile --      Boys Systolic BP Percentile --      Boys Diastolic BP Percentile --      Pulse Rate 07/09/24 0907 63     Resp 07/09/24 0907 18     Temp 07/09/24 0907 98.4 F (36.9 C)     Temp src --      SpO2 07/09/24 0907 97 %     Weight --      Height --      Head Circumference --      Peak Flow --      Pain Score 07/09/24 0900 0     Pain Loc --      Pain Education --      Exclude from Growth Chart --    No data found.  Updated Vital Signs BP 112/72   Pulse 63   Temp 98.4 F (36.9 C)   Resp 18   LMP  (LMP Unknown) Comment: irregular periods last period 2-3 months ago.  SpO2 97%   Visual Acuity Right Eye Distance:   Left Eye Distance:   Bilateral Distance:    Right Eye Near:   Left Eye Near:    Bilateral Near:     Physical Exam Vitals reviewed.  Constitutional:      General: She is awake. She is not in acute distress.    Appearance: Normal appearance. She is well-developed. She is not ill-appearing.     Comments: Very pleasant female presented age in no acute distress sitting comfortable in exam room  HENT:     Head: Normocephalic and atraumatic.  Cardiovascular:     Rate and Rhythm: Normal rate and regular rhythm.     Heart sounds: Normal heart sounds, S1 normal and S2 normal. No murmur heard. Pulmonary:     Effort: Pulmonary effort is normal.     Breath sounds: Normal breath sounds. No wheezing, rhonchi or rales.     Comments: Clear to auscultation bilaterally Abdominal:     Palpations:  Abdomen is soft.     Tenderness: There is no  abdominal tenderness.  Skin:    Findings: Rash present. Rash is vesicular.     Comments: Approximately 2 cm x 2 cm nodule/bulla with serous and purulent drainage with surrounding erythema and edema.  No streaking or evidence of lymphangitis.  Psychiatric:        Behavior: Behavior is cooperative.      UC Treatments / Results  Labs (all labs ordered are listed, but only abnormal results are displayed) Labs Reviewed - No data to display  EKG   Radiology No results found.  Procedures Procedures (including critical care time)  Medications Ordered in UC Medications - No data to display  Initial Impression / Assessment and Plan / UC Course  I have reviewed the triage vital signs and the nursing notes.  Pertinent labs & imaging results that were available during my care of the patient were reviewed by me and considered in my medical decision making (see chart for details).     Patient is well-appearing, afebrile, nontoxic, nontachycardic.  Given surrounding erythema/edema, tender to palpation, abnormal discharge will cover for secondary bacterial infection and patient was started on doxycycline  100 mg twice daily for 10 days.  We discussed that she is to avoid prolonged sun exposure while on this medication due to associated photosensitivity.  She is to keep the area clean and apply Bactroban  ointment with dressing changes.  We discussed that given her recent travel to [place] it is possible that this is leishmaniasis which is parasitic infection.  Unfortunately, we are not equipped to test for this in urgent care and so we discussed that if this is not improving with antibiotics as expected she should follow-up with infectious disease and was given the contact information for local group with instruction to call and schedule an appointment as needed.  She does have some erythema and edema in her lower leg but this is localized to the area  of wound and so I suspect it is related to cellulitis/skin infection.  She denies any pain or erythema in the leg itself but we discussed that she is at risk for VTE event given her recent international travel and so if she has redness and swelling of the leg with associated increasing pain she should be reevaluated to consider ultrasound to rule out DVT.  Patient expressed understanding and agreement with treatment plan.  Discussed that if anything worsens or changes she should be seen immediately.  Patient needs additional doses of mefloquine  for malaria prophylaxis.  She denies any regular medications that would interfere with this medicine or significant past medical history that would contraindicate use of this medication.  She has been taking this without side effect or concern and so an additional 2 doses were provided per her request.  Final Clinical Impressions(s) / UC Diagnoses   Final diagnoses:  Skin infection  Insect bite of left lower leg, initial encounter  Need for malaria prophylaxis     Discharge Instructions      I have called in 2 more doses of your malaria prophylaxis as we discussed.  Take these every 7 days as previously prescribed.  I am concerned that this wound is infected.  Start doxycycline  100 mg twice daily for 10 days.  Stay out of the sun while on this medication.  Keep the wound clean with soap and water and apply Bactroban  ointment twice daily with dressing changes.  As we discussed, it is possible that this is a parasitic infection known as leishmaniasis.  If this is  not healing appropriately or if it starts to look abnormal (an ulcer with raised edges) I would like you to follow-up with regional infectious disease.  I believe the swelling in your leg is related to the infection but if you have increasing swelling or pain please return to that we can order ultrasound to make sure that there is no blood clot since you just traveled.  If anything changes or worsens  (additional wounds, change of the wound, spreading redness, pain in your leg, increasing swelling, chest pain, shortness of breath, heart racing, fever) you need to be seen immediately.     ED Prescriptions     Medication Sig Dispense Auth. Provider   mefloquine  (LARIAM ) 250 MG tablet Take 1 tablet (250 mg total) by mouth every 7 (seven) days for 2 doses. 2 tablet Nycholas Rayner K, PA-C   doxycycline  (VIBRAMYCIN ) 100 MG capsule Take 1 capsule (100 mg total) by mouth 2 (two) times daily. 20 capsule Kristi Norment K, PA-C   mupirocin  ointment (BACTROBAN ) 2 % Apply 1 Application topically 2 (two) times daily. 22 g Tannisha Kennington K, PA-C      PDMP not reviewed this encounter.   Sherrell Rocky POUR, PA-C 07/09/24 9050

## 2024-07-09 NOTE — ED Triage Notes (Signed)
 Pt reports she returned from [place] less that 12 hrs ago. Pt has multiple insect bites and one is not healing as well as Pt thinks it should. Site located on posterior heel on Lt . Pt also has swelling to Lt ankle. Bite site is raised and white. Pt has pictures of the bite site when she first saw it bite on skin.

## 2024-07-09 NOTE — ED Triage Notes (Signed)
 PT also reports she needs 2 Malaria pills because her bag has not made it back as of today.

## 2024-10-18 ENCOUNTER — Ambulatory Visit: Admitting: Obstetrics and Gynecology
